# Patient Record
Sex: Female | Born: 1990 | Race: White | Hispanic: No | Marital: Single | Smoking: Former smoker
Health system: Southern US, Community
[De-identification: ages and names within clinical notes are randomized; demographics above are authoritative.]

## PROBLEM LIST (undated history)

## (undated) DIAGNOSIS — Z87891 Personal history of nicotine dependence: Secondary | ICD-10-CM

## (undated) DIAGNOSIS — G43909 Migraine, unspecified, not intractable, without status migrainosus: Secondary | ICD-10-CM

## (undated) DIAGNOSIS — F1010 Alcohol abuse, uncomplicated: Secondary | ICD-10-CM

## (undated) HISTORY — DX: Personal history of nicotine dependence: Z87.891

## (undated) HISTORY — PX: WISDOM TOOTH EXTRACTION: SHX21

## (undated) HISTORY — DX: Migraine, unspecified, not intractable, without status migrainosus: G43.909

## (undated) HISTORY — DX: Alcohol abuse, uncomplicated: F10.10

---

## 2014-09-08 ENCOUNTER — Ambulatory Visit: Payer: Self-pay | Admitting: Family Medicine

## 2015-03-21 ENCOUNTER — Ambulatory Visit (INDEPENDENT_AMBULATORY_CARE_PROVIDER_SITE_OTHER): Payer: Managed Care, Other (non HMO) | Admitting: Physician Assistant

## 2015-03-21 VITALS — BP 122/64 | HR 65 | Temp 98.4°F | Resp 16 | Ht 66.0 in | Wt 130.0 lb

## 2015-03-21 DIAGNOSIS — Z720 Tobacco use: Secondary | ICD-10-CM | POA: Insufficient documentation

## 2015-03-21 DIAGNOSIS — H6983 Other specified disorders of Eustachian tube, bilateral: Secondary | ICD-10-CM | POA: Diagnosis not present

## 2015-03-21 DIAGNOSIS — H6693 Otitis media, unspecified, bilateral: Secondary | ICD-10-CM | POA: Diagnosis not present

## 2015-03-21 DIAGNOSIS — Z3041 Encounter for surveillance of contraceptive pills: Secondary | ICD-10-CM

## 2015-03-21 MED ORDER — AMOXICILLIN 875 MG PO TABS: 875.0000 mg | ORAL_TABLET | Freq: Two times a day (BID) | ORAL | Status: AC

## 2015-03-21 MED ORDER — IPRATROPIUM BROMIDE 0.03 % NA SOLN: 2.0000 | Freq: Two times a day (BID) | NASAL | Status: DC

## 2015-03-21 MED ORDER — PSEUDOEPHEDRINE HCL ER 120 MG PO TB12: 120.0000 mg | ORAL_TABLET | Freq: Two times a day (BID) | ORAL | Status: DC

## 2015-03-21 NOTE — Patient Instructions (Signed)
Take sudafed once a day in the morning. Use atrovent nasal spray twice a day. Take antibiotic twice a day for 10 days. Take all antibiotics even if you start to feel better. Return if not resolved in 2 weeks.

## 2015-03-21 NOTE — Progress Notes (Signed)
[facility] [address], [place] [address] [phone]  Date:  03/21/2015   Name:  Heidi Long   DOB:  1991-01-24   MRN:  [id number]  PCP:  No primary care provider on file.    Chief Complaint: Ear Pain and Nasal Congestion   History of Present Illness:  This is a 24 y.o. female who is presenting with 3 days of nasal congestion and 1 day of bilateral otalgia. Nasal congestion started 3 days ago after "I feel asleep with a wet head". She was taking dayquil which was helping some. Today she flew from [place] to [place] and when she got off the plane she was having severe bilateral ear pain. She states she feels dizzy. Has had some sore throat. Denies fever, chills, cough. Has never had ETD before. She did have a history of recurrent otitis media in childhood.   Pt is a smoker and takes OCPs. She is aware of the risk of blood clots. She states she knows she needs to quit but is not quite ready yet.  Review of Systems:  Review of Systems See HPI  There are no active problems to display for this patient.   Prior to Admission medications   Medication Sig Start Date End Date Taking? Authorizing Provider  levonorgestrel-ethinyl estradiol (AVIANE,ALESSE,LESSINA) 0.1-20 MG-MCG tablet Take 1 tablet by mouth daily.   Yes Historical Provider, MD    No Known Allergies  History reviewed. No pertinent past surgical history.  Social History  Substance Use Topics  . Smoking status: Current Every Day Smoker -- 0.25 packs/day for 6 years    Types: Cigarettes  . Smokeless tobacco: None  . Alcohol Use: 3.0 oz/week    5 Standard drinks or equivalent per week    History reviewed. No pertinent family history.  Medication list has been reviewed and updated.  Physical Examination:  Physical Exam  Constitutional: She is oriented to person, place, and time. She appears well-developed and well-nourished. No distress.  HENT:  Head: Normocephalic and atraumatic.   Right Ear: Hearing, external ear and ear canal normal. Tympanic membrane is erythematous and bulging.  Left Ear: Hearing, external ear and ear canal normal. Tympanic membrane is erythematous and bulging.  Nose: Nose normal.  Mouth/Throat: Uvula is midline and mucous membranes are normal. Posterior oropharyngeal erythema present. No oropharyngeal exudate or posterior oropharyngeal edema.  Eyes: Conjunctivae and lids are normal. Right eye exhibits no discharge. Left eye exhibits no discharge. No scleral icterus.  Cardiovascular: Normal rate, regular rhythm, normal heart sounds and normal pulses.   No murmur heard. Pulmonary/Chest: Effort normal and breath sounds normal. No respiratory distress. She has no wheezes. She has no rhonchi. She has no rales.  Musculoskeletal: Normal range of motion.  Lymphadenopathy:       Head (right side): No submental, no submandibular and no tonsillar adenopathy present.       Head (left side): No submental, no submandibular and no tonsillar adenopathy present.    She has no cervical adenopathy.  Neurological: She is alert and oriented to person, place, and time.  Skin: Skin is warm, dry and intact. No lesion and no rash noted.  Psychiatric: She has a normal mood and affect. Her speech is normal and behavior is normal. Thought content normal.   BP 122/64 mmHg  Pulse 65  Temp(Src) 98.4 F (36.9 C) (Oral)  Resp 16  Ht 5\' 6"  (1.676 m)  Wt 130 lb (58.968 kg)  BMI 20.99  kg/m2  SpO2 99%  Assessment and Plan:  1. ETD (eustachian tube dysfunction), bilateral 2. Bilateral acute otitis media History is consistent with ETD, exam with bilateral bulging and erythematous TMs resembling early OM. Will treat ETD with sudafed and atrovent. Will treat OM with amoxil. Return if symptoms not improved after finishing abx. - pseudoephedrine (SUDAFED 12 HOUR) 120 MG 12 hr tablet; Take 1 tablet (120 mg total) by mouth 2 (two) times daily.  Dispense: 30 tablet; Refill: 0 -  ipratropium (ATROVENT) 0.03 % nasal spray; Place 2 sprays into both nostrils 2 (two) times daily.  Dispense: 30 mL; Refill: 0 - amoxicillin (AMOXIL) 875 MG tablet; Take 1 tablet (875 mg total) by mouth 2 (two) times daily.  Dispense: 20 tablet; Refill: 0  3. Tobacco abuse 4. Oral contraceptive use We discussed the dangers of tobacco use and OCP use. She understands the risks and knows she needs to stop smoking. She has not smoked in the past few days d/t illness. I advised this is the perfect time to quit.  Roswell Miners Dyke Brackett, MHS [facility]  03/21/2015

## 2015-08-30 ENCOUNTER — Ambulatory Visit (INDEPENDENT_AMBULATORY_CARE_PROVIDER_SITE_OTHER): Payer: 59 | Admitting: Physician Assistant

## 2015-08-30 VITALS — BP 120/80 | HR 85 | Temp 99.3°F | Resp 17 | Ht 67.0 in | Wt 133.0 lb

## 2015-08-30 DIAGNOSIS — R6889 Other general symptoms and signs: Secondary | ICD-10-CM

## 2015-08-30 DIAGNOSIS — J069 Acute upper respiratory infection, unspecified: Secondary | ICD-10-CM

## 2015-08-30 DIAGNOSIS — R0981 Nasal congestion: Secondary | ICD-10-CM | POA: Diagnosis not present

## 2015-08-30 LAB — POCT INFLUENZA A/B
Influenza A, POC: NEGATIVE
Influenza B, POC: NEGATIVE

## 2015-08-30 MED ORDER — GUAIFENESIN ER 1200 MG PO TB12: 1.0000 | ORAL_TABLET | Freq: Two times a day (BID) | ORAL | Status: DC | PRN

## 2015-08-30 NOTE — Progress Notes (Signed)
[facility] [address] [place] [address] [phone]  Date:  08/30/2015   Name:  Heidi Long   DOB:  04/19/1991   MRN:  [id number]  PCP:  No primary care provider on file.    History of Present Illness:  Heidi Long is a 25 y.o. female patient who presents to [facility] for cc of nasal congestion, sneezing, and sore throat.    Saturday, she was having sneezing, and sore throat.  No cough at this time.  She has mild nasal congestion.  She felt subjective fever and chills.  She has not taken anything for her symptoms.    Patient Active Problem List   Diagnosis Date Noted  . Tobacco abuse 03/21/2015  . Oral contraceptive use 03/21/2015    No past medical history on file.  No past surgical history on file.  Social History  Substance Use Topics  . Smoking status: Former Smoker -- 0.25 packs/day for 6 years    Types: Cigarettes    Quit date: 05/30/2015  . Smokeless tobacco: None  . Alcohol Use: 3.0 oz/week    5 Standard drinks or equivalent per week    No family history on file.  No Known Allergies  Medication list has been reviewed and updated.  Current Outpatient Prescriptions on File Prior to Visit  Medication Sig Dispense Refill  . levonorgestrel-ethinyl estradiol (AVIANE,ALESSE,LESSINA) 0.1-20 MG-MCG tablet Take 1 tablet by mouth daily.     No current facility-administered medications on file prior to visit.    ROS ROS otherwise unremarkable unless listed above.   Physical Examination: BP 120/80 mmHg  Pulse 85  Temp(Src) 99.3 F (37.4 C) (Oral)  Resp 17  Ht  (1.702 m)  Wt 133 lb (60.328 kg)  BMI 20.83 kg/m2  SpO2 99%  LMP 08/23/2015 Ideal Body Weight: Weight in (lb) to have BMI = 25: 159.3  Physical Exam  Constitutional: She is oriented to person, place, and time. She appears well-developed and well-nourished. No distress.  HENT:  Head: Normocephalic and atraumatic.  Right Ear: Tympanic membrane, external  ear and ear canal normal.  Left Ear: Tympanic membrane, external ear and ear canal normal.  Nose: Mucosal edema and rhinorrhea present. Right sinus exhibits no maxillary sinus tenderness and no frontal sinus tenderness. Left sinus exhibits no maxillary sinus tenderness and no frontal sinus tenderness.  Mouth/Throat: No uvula swelling. No oropharyngeal exudate, posterior oropharyngeal edema or posterior oropharyngeal erythema.  Eyes: Conjunctivae and EOM are normal. Pupils are equal, round, and reactive to light.  Cardiovascular: Normal rate and regular rhythm.  Exam reveals no gallop, no distant heart sounds and no friction rub.   No murmur heard. Pulmonary/Chest: Effort normal. No respiratory distress. She has no decreased breath sounds. She has no wheezes. She has no rhonchi.  Lymphadenopathy:       Head (right side): No submandibular, no tonsillar, no preauricular and no posterior auricular adenopathy present.       Head (left side): No submandibular, no tonsillar, no preauricular and no posterior auricular adenopathy present.  Neurological: She is alert and oriented to person, place, and time.  Skin: She is not diaphoretic.  Psychiatric: She has a normal mood and affect. Her behavior is normal.     Assessment and Plan: Heidi Long is a 25 y.o. female who is here today for congestion or congestion.   Acute upper respiratory infection - Plan: Guaifenesin (MUCINEX MAXIMUM STRENGTH) 1200 MG TB12  Flu-like symptoms -  Plan: POCT Influenza A/B, Guaifenesin (MUCINEX MAXIMUM STRENGTH) 1200 MG TB12  Nasal congestion - Plan: Guaifenesin (MUCINEX MAXIMUM STRENGTH) 1200 MG TB12   Trena Platt, PA-C [facility] 08/30/2015 7:04 PM

## 2015-08-30 NOTE — Patient Instructions (Signed)
Please hydrate 64 oz of water per day or more.   Take tylenol or ibuprofen every 6-8 hours, respectively, for pain or fever You can use over the counter delsym for cough Upper Respiratory Infection, Adult Most upper respiratory infections (URIs) are a viral infection of the air passages leading to the lungs. A URI affects the nose, throat, and upper air passages. The most common type of URI is nasopharyngitis and is typically referred to as "the common cold." URIs run their course and usually go away on their own. Most of the time, a URI does not require medical attention, but sometimes a bacterial infection in the upper airways can follow a viral infection. This is called a secondary infection. Sinus and middle ear infections are common types of secondary upper respiratory infections. Bacterial pneumonia can also complicate a URI. A URI can worsen asthma and chronic obstructive pulmonary disease (COPD). Sometimes, these complications can require emergency medical care and may be life threatening.  CAUSES Almost all URIs are caused by viruses. A virus is a type of germ and can spread from one person to another.  RISKS FACTORS You may be at risk for a URI if:   You smoke.   You have chronic heart or lung disease.  You have a weakened defense (immune) system.   You are very young or very old.   You have nasal allergies or asthma.  You work in crowded or poorly ventilated areas.  You work in health care facilities or schools. SIGNS AND SYMPTOMS  Symptoms typically develop 2-3 days after you come in contact with a cold virus. Most viral URIs last 7-10 days. However, viral URIs from the influenza virus (flu virus) can last 14-18 days and are typically more severe. Symptoms may include:   Runny or stuffy (congested) nose.   Sneezing.   Cough.   Sore throat.   Headache.   Fatigue.   Fever.   Loss of appetite.   Pain in your forehead, behind your eyes, and over your  cheekbones (sinus pain).  Muscle aches.  DIAGNOSIS  Your health care provider may diagnose a URI by:  Physical exam.  Tests to check that your symptoms are not due to another condition such as:  Strep throat.  Sinusitis.  Pneumonia.  Asthma. TREATMENT  A URI goes away on its own with time. It cannot be cured with medicines, but medicines may be prescribed or recommended to relieve symptoms. Medicines may help:  Reduce your fever.  Reduce your cough.  Relieve nasal congestion. HOME CARE INSTRUCTIONS   Take medicines only as directed by your health care provider.   Gargle warm saltwater or take cough drops to comfort your throat as directed by your health care provider.  Use a warm mist humidifier or inhale steam from a shower to increase air moisture. This may make it easier to breathe.  Drink enough fluid to keep your urine clear or pale yellow.   Eat soups and other clear broths and maintain good nutrition.   Rest as needed.   Return to work when your temperature has returned to normal or as your health care provider advises. You may need to stay home longer to avoid infecting others. You can also use a face mask and careful hand washing to prevent spread of the virus.  Increase the usage of your inhaler if you have asthma.   Do not use any tobacco products, including cigarettes, chewing tobacco, or electronic cigarettes. If you need help quitting,  ask your health care provider. PREVENTION  The best way to protect yourself from getting a cold is to practice good hygiene.   Avoid oral or hand contact with people with cold symptoms.   Wash your hands often if contact occurs.  There is no clear evidence that vitamin C, vitamin E, echinacea, or exercise reduces the chance of developing a cold. However, it is always recommended to get plenty of rest, exercise, and practice good nutrition.  SEEK MEDICAL CARE IF:   You are getting worse rather than better.    Your symptoms are not controlled by medicine.   You have chills.  You have worsening shortness of breath.  You have brown or red mucus.  You have yellow or brown nasal discharge.  You have pain in your face, especially when you bend forward.  You have a fever.  You have swollen neck glands.  You have pain while swallowing.  You have white areas in the back of your throat. SEEK IMMEDIATE MEDICAL CARE IF:   You have severe or persistent:  Headache.  Ear pain.  Sinus pain.  Chest pain.  You have chronic lung disease and any of the following:  Wheezing.  Prolonged cough.  Coughing up blood.  A change in your usual mucus.  You have a stiff neck.  You have changes in your:  Vision.  Hearing.  Thinking.  Mood. MAKE SURE YOU:   Understand these instructions.  Will watch your condition.  Will get help right away if you are not doing well or get worse.   This information is not intended to replace advice given to you by your health care provider. Make sure you discuss any questions you have with your health care provider.   Document Released: 12/19/2000 Document Revised: 11/09/2014 Document Reviewed: 09/30/2013 Elsevier Interactive Patient Education [employer].

## 2015-09-09 ENCOUNTER — Other Ambulatory Visit: Payer: Self-pay | Admitting: Family Medicine

## 2015-09-09 NOTE — Telephone Encounter (Signed)
Please let Eustace QuailRachel Marie Kuhlmann know that I'd like to see patient for an appointment here in the office for:  Physical, birth control Please schedule a visit with me  in the next: 3 weeks Fasting?  Not necessary Thank you, Dr. Kayleen MemosLada I'll send one Rx for OCPs but will need to be seen for any beyond that

## 2015-09-15 ENCOUNTER — Encounter: Payer: Self-pay | Admitting: Family Medicine

## 2015-09-15 NOTE — Telephone Encounter (Signed)
Sent letter home 09/15/15 °

## 2016-05-09 ENCOUNTER — Ambulatory Visit (INDEPENDENT_AMBULATORY_CARE_PROVIDER_SITE_OTHER): Payer: 59 | Admitting: Family Medicine

## 2016-05-09 VITALS — BP 122/80 | HR 73 | Temp 98.4°F | Resp 16 | Ht 66.0 in | Wt 145.2 lb

## 2016-05-09 DIAGNOSIS — R41840 Attention and concentration deficit: Secondary | ICD-10-CM | POA: Diagnosis not present

## 2016-05-09 DIAGNOSIS — R7309 Other abnormal glucose: Secondary | ICD-10-CM

## 2016-05-09 DIAGNOSIS — H1189 Other specified disorders of conjunctiva: Secondary | ICD-10-CM

## 2016-05-09 DIAGNOSIS — R5383 Other fatigue: Secondary | ICD-10-CM | POA: Diagnosis not present

## 2016-05-09 DIAGNOSIS — Z23 Encounter for immunization: Secondary | ICD-10-CM | POA: Diagnosis not present

## 2016-05-09 LAB — COMPLETE METABOLIC PANEL WITH GFR
ALBUMIN: 4.5 g/dL (ref 3.6–5.1)
ALT: 11 U/L (ref 6–29)
AST: 17 U/L (ref 10–30)
Alkaline Phosphatase: 83 U/L (ref 33–115)
BUN: 8 mg/dL (ref 7–25)
CALCIUM: 9.7 mg/dL (ref 8.6–10.2)
CHLORIDE: 104 mmol/L (ref 98–110)
CO2: 26 mmol/L (ref 20–31)
CREATININE: 0.73 mg/dL (ref 0.50–1.10)
GFR, Est African American: >89 mL/min (ref >=60)
GFR, Est Non African American: >89 mL/min (ref >=60)
GLUCOSE: 88 mg/dL (ref 65–99)
Potassium: 4.3 mmol/L (ref 3.5–5.3)
Sodium: 140 mmol/L (ref 135–146)
Total Bilirubin: 0.5 mg/dL (ref 0.2–1.2)
Total Protein: 7 g/dL (ref 6.1–8.1)

## 2016-05-09 LAB — POCT CBC
Granulocyte percent: 67.2 %G (ref 37–80)
HCT, POC: 37.1 % — AB (ref 37.7–47.9)
HEMOGLOBIN: 13.5 g/dL (ref 12.2–16.2)
Lymph, poc: 2 (ref 0.6–3.4)
MCH: 32.3 pg — AB (ref 27–31.2)
MCHC: 36.4 g/dL — AB (ref 31.8–35.4)
MCV: 88.6 fL (ref 80–97)
MID (CBC): 0.6 (ref 0–0.9)
MPV: 9.3 fL (ref 0–99.8)
PLATELET COUNT, POC: 206 10*3/uL (ref 142–424)
POC Granulocyte: 5.4 (ref 2–6.9)
POC LYMPH PERCENT: 24.7 %L (ref 10–50)
POC MID %: 8.1 %M (ref 0–12)
RBC: 4.19 M/uL (ref 4.04–5.48)
RDW, POC: 12.5 %
WBC: 8 10*3/uL (ref 4.6–10.2)

## 2016-05-09 LAB — GLUCOSE, POCT (MANUAL RESULT ENTRY): POC GLUCOSE: 92 mg/dL (ref 70–99)

## 2016-05-09 LAB — POCT GLYCOSYLATED HEMOGLOBIN (HGB A1C): HEMOGLOBIN A1C: 4.9

## 2016-05-09 LAB — TSH: TSH: 0.89 m[IU]/L

## 2016-05-09 NOTE — Patient Instructions (Addendum)
Your blood glucose level was normal today.  I do not suspect diabetes is the cause of your symptoms.  Your CBC (looks for anemia and infection) was normal.  I am checking for electrolyte abnormalities, kidney function, liver function, thyroid function and to see if there is any evidence of CO poisoning.  I will contact you with the results of these labs.  If your symptoms worsen, please seek immediate medical attention in the emergency department.   IF you received an x-ray today, you will receive an invoice from [facility]. Please contact [facility] at [phone] with questions or concerns regarding your invoice.   IF you received labwork today, you will receive an invoice from [facility]. Please contact [facility] at [phone] with questions or concerns regarding your invoice.   Our billing staff will not be able to assist you with questions regarding bills from these companies.  You will be contacted with the lab results as soon as they are available. The fastest way to get your results is to activate your My Chart account. Instructions are located on the last page of this paperwork. If you have not heard from us regarding the results in 2 weeks, please contact this office.

## 2016-05-09 NOTE — Progress Notes (Signed)
Subjective: CC: abnormal blood glucose ZOX:[id number]:Heidi Long is a 25 y.o. female presenting to clinic today for same day appointment. PCP: No PCP Per Patient Concerns today include:  Abnormal blood glucose Patient reports that over the last 4 days she has felt out of sorts, notes difficulty concentrating, thinking, performing simple tasks.  She feels like she is "high".  She reports increased thirst.  Urination is normal.  Endorses light sensitivity, tingling around eyes and on face.  She denies LOC.  She reports she took a POC BG and it was 158 about 2 hours after eating.  Denies headaches, numbness/ tingling in hands or feet.  No recent illness.  Denies heavy menstrual cycles.  Currently menstruating.  Not on any medications.  No known medical problems.  Reports a history of thyroid disorder on paternal side.  She reports family history of T2DM on her mother's side.  Last meal today at 12pm.  Social History Reviewed: non smoker. FamHx and MedHx reviewed.  Please see EMR.  ROS: Per HPI  Objective: Office vital signs reviewed. BP 122/80   Pulse 73   Temp 98.4 F (36.9 C) (Oral)   Resp 16   Ht 5\' 6"  (1.676 m)   Wt 145 lb 3.2 oz (65.9 kg)   LMP 05/09/2016   SpO2 100%   BMI 23.44 kg/m   Physical Examination:  General: Awake, alert, well nourished, No acute distress HEENT: Normal    Neck: No masses palpated. No lymphadenopathy, no thyromegaly or palpable nodules    Eyes: PERRLA, EOMI, conjunctival pallor noted.    Nose: nasal turbinates moist    Throat: moist mucus membranes, no erythema Cardio: regular rate and rhythm, S1S2 heard, no murmurs appreciated Pulm: clear to auscultation bilaterally, no wheezes, rhonchi or rales, normal WOB on room air Extremities: warm, well perfused, No edema, cyanosis or clubbing; +2 raidal pulses bilaterally MSK: Normal gait and station Neuro: Strength and sensation grossly intact, CN 2-12 grossly in tact, no focal neurologic  deficits  Orthostatic VS for the past 24 hrs:  BP- Lying Pulse- Lying BP- Sitting Pulse- Sitting BP- Standing at 0 minutes Pulse- Standing at 0 minutes  05/09/16 1528 115/76 59 126/82 64 119/78 73   Results for orders placed or performed in visit on 05/09/16 (from the past 24 hour(s))  POCT glucose (manual entry)     Status: None   Collection Time: 05/09/16  3:04 PM  Result Value Ref Range   POC Glucose 92 70 - 99 mg/dl  POCT CBC     Status: Abnormal   Collection Time: 05/09/16  3:05 PM  Result Value Ref Range   WBC 8.0 4.6 - 10.2 K/uL   Lymph, poc 2.0 0.6 - 3.4   POC LYMPH PERCENT 24.7 10 - 50 %L   MID (cbc) 0.6 0 - 0.9   POC MID % 8.1 0 - 12 %M   POC Granulocyte 5.4 2 - 6.9   Granulocyte percent 67.2 37 - 80 %G   RBC 4.19 4.04 - 5.48 M/uL   Hemoglobin 13.5 12.2 - 16.2 g/dL   HCT, POC 40.937.1 (A) 81.137.7 - 47.9 %   MCV 88.6 80 - 97 fL   MCH, POC 32.3 (A) 27 - 31.2 pg   MCHC 36.4 (A) 31.8 - 35.4 g/dL   RDW, POC 91.412.5 %   Platelet Count, POC 206 142 - 424 K/uL   MPV 9.3 0 - 99.8 fL  POCT glycosylated hemoglobin (Hb A1C)  Status: None   Collection Time: 05/09/16  3:36 PM  Result Value Ref Range   Hemoglobin A1C 4.9    Assessment/ Plan: 25 y.o. female   1. Difficulty concentrating.  Unclear etiology at this time.  DDx includes thyroid dysfunction, early viral infection, CO poisoning.  BG and CBC within normal limits. - COMPLETE METABOLIC PANEL WITH GFR - CBC - TSH - Carboxyhemoglobin  2. Needs flu shot - Flu Vaccine QUAD 36+ mos IM  3. Elevated glucose.  AACE goal for 2h pp BG is <140. ADA is <180.  Patient is nonobese female.  Would be very surprised if she has T2DM.  Could consider DM1.5 as possible dx.  BG here 92. A1c 4.9. - POCT glucose (manual entry) - POCT A1c - Reassurance  4. Conjunctival pallor. Actively menstruating.  Hgb 13.5 is reassuring. - CBC  5. Fatigue, unspecified type. No evidence of anemia on CBC.  BG WNL.  Low suspicion for CO poisoning at this  time but given recent change in season and constellation of symptoms, will check CHgb.  Symptoms could be a result of just working too much, as she is working 12 hour days with few breaks. - COMPLETE METABOLIC PANEL WITH GFR - CBC - TSH - Carboxyhemoglobin - Orthostatic vitals obtained and were normal - Return precautions reviewed - Will contact with lab results.  Ok per patient to leave VM with results. - Follow up prn  Raliegh IpAshly M Monifa Blanchette, DO PGY-3, [facility]

## 2016-05-10 ENCOUNTER — Telehealth: Payer: Self-pay | Admitting: Family Medicine

## 2016-05-10 NOTE — Telephone Encounter (Signed)
Patient reports that she is feeling better this morning.  She notes that she took some B12 last night.  Discussed normal results.  Still awaiting Carboxyhemoglobin level.  Return precautions reviewed with patient.  Mindee Robledo M. Nadine CountsGottschalk, DO PGY-3, [facility]

## 2016-05-11 ENCOUNTER — Telehealth: Payer: Self-pay | Admitting: Family Medicine

## 2016-05-11 LAB — CARBOXYHEMOGLOBIN

## 2016-05-11 NOTE — Telephone Encounter (Signed)
Per patient's request LVM re: labs. Carboxyhemoglobin level negative.  [name], DO PGY-3, [facility]

## 2016-06-14 ENCOUNTER — Ambulatory Visit (INDEPENDENT_AMBULATORY_CARE_PROVIDER_SITE_OTHER): Payer: 59 | Admitting: Family Medicine

## 2016-06-14 ENCOUNTER — Encounter: Payer: Self-pay | Admitting: Family Medicine

## 2016-06-14 ENCOUNTER — Other Ambulatory Visit (HOSPITAL_COMMUNITY)
Admission: RE | Admit: 2016-06-14 | Discharge: 2016-06-14 | Disposition: A | Discharge status: Home / Self Care | Payer: 59 | Source: Ambulatory Visit | Attending: Family Medicine | Admitting: Family Medicine

## 2016-06-14 VITALS — BP 122/92 | HR 76 | Temp 97.8°F | Ht 66.0 in | Wt 144.2 lb

## 2016-06-14 DIAGNOSIS — Z87891 Personal history of nicotine dependence: Secondary | ICD-10-CM | POA: Insufficient documentation

## 2016-06-14 DIAGNOSIS — Z113 Encounter for screening for infections with a predominantly sexual mode of transmission: Secondary | ICD-10-CM | POA: Insufficient documentation

## 2016-06-14 DIAGNOSIS — Z3041 Encounter for surveillance of contraceptive pills: Secondary | ICD-10-CM | POA: Diagnosis not present

## 2016-06-14 DIAGNOSIS — G43909 Migraine, unspecified, not intractable, without status migrainosus: Secondary | ICD-10-CM | POA: Insufficient documentation

## 2016-06-14 DIAGNOSIS — Z7251 High risk heterosexual behavior: Secondary | ICD-10-CM

## 2016-06-14 DIAGNOSIS — Z0000 Encounter for general adult medical examination without abnormal findings: Secondary | ICD-10-CM

## 2016-06-14 DIAGNOSIS — F1010 Alcohol abuse, uncomplicated: Secondary | ICD-10-CM | POA: Insufficient documentation

## 2016-06-14 LAB — CBC
HCT: 41.9 % (ref 36.0–46.0)
HEMOGLOBIN: 14.2 g/dL (ref 12.0–15.0)
MCHC: 33.9 g/dL (ref 30.0–36.0)
MCV: 90.4 fl (ref 78.0–100.0)
Platelets: 240 10*3/uL (ref 150.0–400.0)
RBC: 4.63 Mil/uL (ref 3.87–5.11)
RDW: 13.1 % (ref 11.5–15.5)
WBC: 5.9 10*3/uL (ref 4.0–10.5)

## 2016-06-14 LAB — LIPID PANEL
Cholesterol: 157 mg/dL (ref 0–200)
HDL: 71.2 mg/dL (ref >39.00)
LDL Cholesterol: 70 mg/dL (ref 0–99)
NONHDL: 85.39
Total CHOL/HDL Ratio: 2
Triglycerides: 75 mg/dL (ref 0.0–149.0)
VLDL: 15 mg/dL (ref 0.0–40.0)

## 2016-06-14 LAB — COMPREHENSIVE METABOLIC PANEL
ALK PHOS: 107 U/L (ref 39–117)
ALT: 10 U/L (ref 0–35)
AST: 15 U/L (ref 0–37)
Albumin: 4.6 g/dL (ref 3.5–5.2)
BILIRUBIN TOTAL: 0.7 mg/dL (ref 0.2–1.2)
BUN: 7 mg/dL (ref 6–23)
CO2: 30 mEq/L (ref 19–32)
CREATININE: 0.67 mg/dL (ref 0.40–1.20)
Calcium: 9.5 mg/dL (ref 8.4–10.5)
Chloride: 103 mEq/L (ref 96–112)
GFR: 113.75 mL/min (ref >60.00)
GLUCOSE: 95 mg/dL (ref 70–99)
Potassium: 4.2 mEq/L (ref 3.5–5.1)
Sodium: 140 mEq/L (ref 135–145)
TOTAL PROTEIN: 7.3 g/dL (ref 6.0–8.3)

## 2016-06-14 LAB — TSH: TSH: 1.06 u[IU]/mL (ref 0.35–4.50)

## 2016-06-14 NOTE — Progress Notes (Signed)
Pre visit review using our clinic review tool, if applicable. No additional management support is needed unless otherwise documented below in the visit note. 

## 2016-06-14 NOTE — Progress Notes (Signed)
Phone: [phone]  Subjective:  Patient presents today to establish care. Heard about us through urgent care. Prior Dr. Sherie DonLadaCheree Ditto- [facility]- did gynecology and primary care for patient.  Chief complaint-noted.   See problem oriented charting  The following were reviewed and entered/updated in epic: Past Medical History:  Diagnosis Date  . Alcohol abuse    Cut down on her own. drunk on regular basis in past in college. Prescription drug abuse- oxycodone and xanax snorting stopped a year or two after college.   . Former smoker    quit 2016. 1/4 PPD 7 years.   . Migraines    2016 was getting them frequently- doing much better now. no medicine   Patient Active Problem List   Diagnosis Date Noted  . Alcohol abuse     Priority: High  . Tobacco abuse 03/21/2015    Priority: High  . Migraines     Priority: Medium  . Oral contraceptive use 03/21/2015    Priority: Medium  . Former smoker     Priority: Low   Past Surgical History:  Procedure Laterality Date  . WISDOM TOOTH EXTRACTION      Family History  Problem Relation Age of Onset  . Depression Mother     also obese  . Drug abuse Mother     prescription drug abuse  . Arrhythmia Father   . Hyperlipidemia Father   . Peripheral vascular disease Father     carotid artery  . Alcohol abuse Father   . Hypertension Father   . Healthy Sister     half sister  . Alcohol abuse Brother   . Heart attack Maternal Grandmother   . Other Maternal Grandfather     accidental death  . Other Paternal Grandmother     "enlarged heart" never saw doctor  . Colon cancer Paternal Grandmother   . Heart attack Paternal Grandfather     under 50    Medications- reviewed and updated Current Outpatient Prescriptions  Medication Sig Dispense Refill  . levonorgestrel-ethinyl estradiol (AVIANE) 0.1-20 MG-MCG tablet Take 1 tablet by mouth daily. Appt/physical needed for further refills 28 tablet 0   No current facility-administered medications for  this visit.     Allergies-reviewed and updated No Known Allergies  Social History   Social History  . Marital status: Single    Spouse name: N/A  . Number of children: N/A  . Years of education: N/A   Social History Main Topics  . Smoking status: Former Smoker    Packs/day: 0.25    Years: 6.00    Types: Cigarettes    Quit date: 05/30/2015  . Smokeless tobacco: Never Used  . Alcohol use 4.2 oz/week    7 Standard drinks or equivalent per week  . Drug use: No  . Sexual activity: Not Asked   Other Topics Concern  . None   Social History Narrative   Single. Lives alone. 2 kitties.       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       Hobbies: youtube and music, enjoys makeup.     ROS--Full ROS was completed Review of Systems  Constitutional: Negative for chills and fever.  HENT: Negative for ear discharge and ear pain.   Eyes: Negative for blurred vision.  Respiratory: Negative for cough and hemoptysis.  Cardiovascular: Negative for chest pain and palpitations.  Gastrointestinal: Negative for heartburn and nausea.  Genitourinary: Negative for dysuria and urgency.  Musculoskeletal: Negative for myalgias and neck pain.  Skin: Negative for itching and rash.  Neurological: Negative for dizziness and headaches.  Endo/Heme/Allergies: Negative for polydipsia. Does not bruise/bleed easily.  Psychiatric/Behavioral: Positive for substance abuse (in past but not recently). Negative for hallucinations. The patient is nervous/anxious.    Objective: BP (!) 122/92   Pulse 76   Temp 97.8 F (36.6 C) (Oral)   Ht 5\' 6"  (1.676 m)   Wt 144 lb 3.2 oz (65.4 kg)   LMP 06/12/2016 (Exact Date)   SpO2 98%   BMI 23.27 kg/m  Gen: NAD, resting comfortably HEENT: Mucous membranes are moist. Oropharynx normal. TM normal. Eyes: sclera and  lids normal, PERRLA Neck: no thyromegaly, no cervical lymphadenopathy CV: RRR no murmurs rubs or gallops Lungs: CTAB no crackles, wheeze, rhonchi Abdomen: soft/nontender/nondistended/normal bowel sounds. No rebound or guarding.  Ext: no edema Skin: warm, dry Neuro: 5/5 strength in upper and lower extremities, normal gait, normal reflexes  Assessment/Plan:  25 y.o. female presenting for annual physical.  Health Maintenance counseling: 1. Anticipatory guidance: Patient counseled regarding regular dental exams, eye exams (contacts), wearing seatbelts.  2. Risk factor reduction:  Advised patient of need for regular exercise and diet rich and fruits and vegetables to reduce risk of heart attack and stroke. Not exercising- advised to start. Diet much improved for about last 2 months- feels much better.  3. Immunizations/screenings/ancillary studies Immunization History  Administered Date(s) Administered  . Influenza,inj,Quad PF,36+ Mos 05/09/2016  . Tdap 03/11/2010   Health Maintenance Due  Topic Date Due  . HIV Screening - with future fasting labs 03/11/2006  . PAP SMEAR - get records 03/11/2012   4. Cervical cancer screening- get records 5. Breast cancer screening-  breast exam defer and mammogram starting age 40 or 62. Apparently had a benign cyst on exam but mammogram normal a few years ago.  6. Colon cancer screening - no family history other than grandma age 21, start at age 69.  10. Skin cancer prevention- advised regular sunscreen use  8. STD screening- not using protection- opts into std testing 9. Birth control- advised condoms. Wants to restart birth control. LMP 10 days ago. Will get u preg given unprotected sex just to confirm before restarting. Migraine history but without aura  Will monitor BP- patient admits to anxiety with new doctor. Discussed 130/80 goal.  BP Readings from Last 3 Encounters:  06/14/16 (!) 122/92  05/09/16 122/80  08/30/15 120/80   Month or two ago  body was reacting oddly. States she changed diet to more vegetarian and really seemed to help. Vision was off, slightly confused- ? Withdrawals from alcohol- had prior cut down on alcohol use so likely was not related to that.   1 year CPE   Orders Placed This Encounter  Procedures  . CBC    [place]    Standing Status:   Future    Number of Occurrences:   1    Standing Expiration Date:   06/14/2017  . Comprehensive metabolic panel        Standing Status:   Future    Number of Occurrences:   1    Standing Expiration Date:   06/14/2017    Order Specific Question:   Has the patient fasted?    Answer:   No  . Lipid panel      Standing Status:   Future    Number of Occurrences:   1    Standing Expiration Date:   06/14/2017    Order Specific Question:   Has the patient fasted?    Answer:   No  . TSH    [place]    Standing Status:   Future    Number of Occurrences:   1    Standing Expiration Date:   06/14/2017  . HIV antibody    Standing Status:   Future    Number of Occurrences:   1    Standing Expiration Date:   06/14/2017  . RPR    [facility]    Standing Status:   Future    Number of Occurrences:   1    Standing Expiration Date:   06/14/2017  . POCT urine pregnancy   Return precautions advised.  Tana ConchStephen Hunter, MD

## 2016-06-14 NOTE — Patient Instructions (Addendum)
Sign release of information at the check out desk for records from prior PCP- definitely want pap smear copy  Urine pregnancy before you leave- if negative- send birth control in  Always advise protected sex  Goal 150 minutes a week exercise  BP Readings from Last 3 Encounters:  06/14/16 (!) 122/92  05/09/16 122/80  08/30/15 120/80  goal blood pressure <130/80. Regular exercise, healthy eating can help- glad you have been working on this. Some of this couldbe stress related as well  Schedule a lab visit at the check out desk within 2 weeks. Return for future fasting labs meaning nothing but water after midnight please. Ok to take your medications with water. Do not pee for an hour before you come in for the urine STD test

## 2016-06-15 LAB — RPR

## 2016-06-15 LAB — URINE CYTOLOGY ANCILLARY ONLY
Chlamydia: NEGATIVE
Neisseria Gonorrhea: NEGATIVE
TRICH (WINDOWPATH): NEGATIVE

## 2016-06-15 LAB — HIV ANTIBODY (ROUTINE TESTING W REFLEX): HIV: NONREACTIVE

## 2016-06-18 ENCOUNTER — Other Ambulatory Visit (INDEPENDENT_AMBULATORY_CARE_PROVIDER_SITE_OTHER): Payer: 59

## 2016-06-18 ENCOUNTER — Other Ambulatory Visit: Payer: Self-pay | Admitting: Family Medicine

## 2016-06-18 DIAGNOSIS — Z309 Encounter for contraceptive management, unspecified: Secondary | ICD-10-CM | POA: Diagnosis not present

## 2016-06-18 LAB — POCT URINE PREGNANCY: PREG TEST UR: NEGATIVE

## 2016-06-18 MED ORDER — LEVONORGESTREL-ETHINYL ESTRAD 0.1-20 MG-MCG PO TABS: 1.0000 | ORAL_TABLET | Freq: Every day | ORAL | 12 refills | Status: DC

## 2017-02-20 ENCOUNTER — Ambulatory Visit (INDEPENDENT_AMBULATORY_CARE_PROVIDER_SITE_OTHER): Payer: 59 | Admitting: Family Medicine

## 2017-02-20 ENCOUNTER — Encounter: Payer: Self-pay | Admitting: Family Medicine

## 2017-02-20 VITALS — BP 102/82 | HR 83 | Temp 98.3°F | Ht 66.0 in | Wt 136.4 lb

## 2017-02-20 DIAGNOSIS — F419 Anxiety disorder, unspecified: Secondary | ICD-10-CM | POA: Diagnosis not present

## 2017-02-20 LAB — COMPREHENSIVE METABOLIC PANEL
ALK PHOS: 90 U/L (ref 39–117)
ALT: 11 U/L (ref 0–35)
AST: 15 U/L (ref 0–37)
Albumin: 4.8 g/dL (ref 3.5–5.2)
BILIRUBIN TOTAL: 0.8 mg/dL (ref 0.2–1.2)
BUN: 8 mg/dL (ref 6–23)
CALCIUM: 9.9 mg/dL (ref 8.4–10.5)
CO2: 30 meq/L (ref 19–32)
CREATININE: 0.68 mg/dL (ref 0.40–1.20)
Chloride: 105 mEq/L (ref 96–112)
GFR: 111.21 mL/min (ref >60.00)
Glucose, Bld: 91 mg/dL (ref 70–99)
Potassium: 4.4 mEq/L (ref 3.5–5.1)
Sodium: 140 mEq/L (ref 135–145)
TOTAL PROTEIN: 6.8 g/dL (ref 6.0–8.3)

## 2017-02-20 LAB — CBC
HCT: 43.4 % (ref 36.0–46.0)
Hemoglobin: 14.5 g/dL (ref 12.0–15.0)
MCHC: 33.4 g/dL (ref 30.0–36.0)
MCV: 91.5 fl (ref 78.0–100.0)
Platelets: 259 10*3/uL (ref 150.0–400.0)
RBC: 4.74 Mil/uL (ref 3.87–5.11)
RDW: 13.5 % (ref 11.5–15.5)
WBC: 5 10*3/uL (ref 4.0–10.5)

## 2017-02-20 LAB — TSH: TSH: 0.83 u[IU]/mL (ref 0.35–4.50)

## 2017-02-20 MED ORDER — BUSPIRONE HCL 5 MG PO TABS: 5.0000 mg | ORAL_TABLET | Freq: Three times a day (TID) | ORAL | 2 refills | Status: DC | PRN
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
## 2017-02-20 NOTE — Patient Instructions (Signed)
Please stop by lab before you go- rule out causes of anxiety  Trial buspirone 5mg  up to three times a day as needed  Would strongly encourage seeing one of our psychologists/counselors- and definitely doing this in [place]  Would love to check in with you 1 month from now to see how you are doing

## 2017-02-21 DIAGNOSIS — F419 Anxiety disorder, unspecified: Secondary | ICD-10-CM | POA: Insufficient documentation

## 2018-07-29 DIAGNOSIS — J019 Acute sinusitis, unspecified: Secondary | ICD-10-CM | POA: Diagnosis not present

## 2018-08-05 DIAGNOSIS — J019 Acute sinusitis, unspecified: Secondary | ICD-10-CM | POA: Diagnosis not present

## 2018-12-09 DIAGNOSIS — H10811 Pingueculitis, right eye: Secondary | ICD-10-CM | POA: Diagnosis not present

## 2018-12-24 DIAGNOSIS — M9901 Segmental and somatic dysfunction of cervical region: Secondary | ICD-10-CM | POA: Diagnosis not present

## 2018-12-24 DIAGNOSIS — M9902 Segmental and somatic dysfunction of thoracic region: Secondary | ICD-10-CM | POA: Diagnosis not present

## 2018-12-24 DIAGNOSIS — M545 Low back pain: Secondary | ICD-10-CM | POA: Diagnosis not present

## 2018-12-24 DIAGNOSIS — M9905 Segmental and somatic dysfunction of pelvic region: Secondary | ICD-10-CM | POA: Diagnosis not present

## 2018-12-24 DIAGNOSIS — M9903 Segmental and somatic dysfunction of lumbar region: Secondary | ICD-10-CM | POA: Diagnosis not present

## 2018-12-24 DIAGNOSIS — M62838 Other muscle spasm: Secondary | ICD-10-CM | POA: Diagnosis not present

## 2018-12-24 DIAGNOSIS — R600 Localized edema: Secondary | ICD-10-CM | POA: Diagnosis not present

## 2018-12-24 DIAGNOSIS — M542 Cervicalgia: Secondary | ICD-10-CM | POA: Diagnosis not present

## 2019-06-23 DIAGNOSIS — H04123 Dry eye syndrome of bilateral lacrimal glands: Secondary | ICD-10-CM | POA: Diagnosis not present

## 2019-08-05 ENCOUNTER — Ambulatory Visit: Payer: BC Managed Care – PPO | Attending: Internal Medicine

## 2019-08-05 DIAGNOSIS — Z20822 Contact with and (suspected) exposure to covid-19: Secondary | ICD-10-CM

## 2019-08-06 LAB — NOVEL CORONAVIRUS, NAA: SARS-CoV-2, NAA: NOT DETECTED

## 2019-09-08 DIAGNOSIS — M9902 Segmental and somatic dysfunction of thoracic region: Secondary | ICD-10-CM | POA: Diagnosis not present

## 2019-09-08 DIAGNOSIS — M9901 Segmental and somatic dysfunction of cervical region: Secondary | ICD-10-CM | POA: Diagnosis not present

## 2019-09-08 DIAGNOSIS — M791 Myalgia, unspecified site: Secondary | ICD-10-CM | POA: Diagnosis not present

## 2019-09-14 ENCOUNTER — Ambulatory Visit: Payer: BC Managed Care – PPO | Attending: Internal Medicine

## 2019-09-14 DIAGNOSIS — Z20822 Contact with and (suspected) exposure to covid-19: Secondary | ICD-10-CM | POA: Diagnosis not present

## 2019-09-15 LAB — NOVEL CORONAVIRUS, NAA: SARS-CoV-2, NAA: NOT DETECTED

## 2020-01-18 NOTE — Progress Notes (Signed)
Phone [phone] In person visit   Subjective:   Heidi Long is a 29 y.o. year old very pleasant female patient who presents for/with See problem oriented charting Chief Complaint  Patient presents with  . Referral    needs referral to derm  . Acne    on back and sides    This visit occurred during the SARS-CoV-2 public health emergency.  Safety protocols were in place, including screening questions prior to the visit, additional usage of staff PPE, and extensive cleaning of exam room while observing appropriate contact time as indicated for disinfecting solutions.   Past Medical History-  Patient Active Problem List   Diagnosis Date Noted  . Anxiety 02/21/2017    Priority: High  . Alcohol abuse     Priority: High  . Tobacco abuse 03/21/2015    Priority: High  . Migraines     Priority: Medium  . Oral contraceptive use 03/21/2015    Priority: Medium  . Former smoker     Priority: Low    Medications- reviewed and updated Current Outpatient Medications  Medication Sig Dispense Refill  . levonorgestrel-ethinyl estradiol (AVIANE) 0.1-20 MG-MCG tablet Take 1 tablet by mouth daily. 28 tablet 12   No current facility-administered medications for this visit.     Objective:  BP 120/72   Pulse 79   Temp 98.5 F (36.9 C) (Temporal)   Ht 5\' 6"  (1.676 m)   Wt 139 lb (63 kg)   LMP 01/05/2020   SpO2 98%   BMI 22.44 kg/m  Gen: NAD, resting comfortably CV: RRR no murmurs rubs or gallops Lungs: CTAB no crackles, wheeze, rhonchi Ext: no edema Skin: warm, dry, patient with multiple nodules/comedones on her back    Assessment and Plan    #Acne vulgaris-primarily of back #Unprotected sex/desire for birth control #Anxiety S:has acne on back and sides. Has been seen by dermatology in the past. Also has dry skin on arms-has to exfoliate this aggressively or can get bumps on the arms that become itchy. Tried scrubs and topical over the counter treatments no improvement  forearms and back  Patient is dating/sexually active with boyfriend.  Does not use condoms.  LMP June 29.With PMS, she has noted feels irritable. In college felt more emotional on birth control but more recently has done well with this.  Stopped birth control around 2 years ago when moved to [place].  Has a history of some anxiety issues-felt too sleepy on buspirone.  Does not want medication at this time beyond seeing how she does on birth control.  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  She is also working on her MBA at [employer].  She has to [place] for work  She feels like acne may have been worse since that time.  Did well on aviane birth control in the past.  She did quit smoking which lowers VTE risk A/P: For unprotected sex/birth control counseling-patient is going to restart combined oral contraceptives.  She has also had worsening acne particularly on her back-this may help her acne but if not she would like to have a referral to dermatology.  We discussed possible options such as doxycycline orally but she would like to hold off and see dermatology.  She would also like dermatology's opinion on thickened skin on her arms  which requires a lot of exfoliation  From [facility]:  " We need to check a urine pregnancy in 2 weeks-please schedule before you leave.  Please use backup protection for pregnancy with condoms next 2 weeks  This possibly could help with acne but if not we are getting you back up referral to dermatology  We will call you within two weeks about your referral to dermatology. If you do not hear within 3 weeks, give [place] a call.  "  Anxiety-feels like she is in a reasonable place recently and wants to continue without medication or counseling for now   Recommended follow up: Return for CPE with  pap.  Lab/Order associations:   ICD-10-CM   1. Acne vulgaris  L70.0 Ambulatory referral to Dermatology  2. Unprotected sex  Z72.51 POCT urine pregnancy    Meds ordered this encounter  Medications  . levonorgestrel-ethinyl estradiol (AVIANE) 0.1-20 MG-MCG tablet    Sig: Take 1 tablet by mouth daily.    Dispense:  28 tablet    Refill:  12    Time Spent: 25 minutes of total time (1:00 PM- 1:25 PM) was spent on the date of the encounter performing the following actions: chart review prior to seeing the patient, obtaining history, performing a medically necessary exam, counseling on the treatment plan, placing orders, and documenting in our EHR.   Return precautions advised.  Tana Conch, MD

## 2020-01-18 NOTE — Patient Instructions (Addendum)
Health Maintenance Due  Topic Date Due  . Hepatitis C Screening - consider with physical bloodwork Never done  . PAP SMEAR-Modifier -Cervical Cytology Screening will make app for CPE  Never done    Restart birth control pill.  We need to check a urine pregnancy in 2 weeks-please schedule before you leave.  Please use backup protection for pregnancy with condoms next 2 weeks  This possibly could help with acne but if not we are getting you back up referral to dermatology  We will call you within two weeks about your referral to dermatology. If you do not hear within 3 weeks, give [place] a call.

## 2020-01-19 ENCOUNTER — Encounter: Payer: Self-pay | Admitting: Family Medicine

## 2020-01-19 ENCOUNTER — Other Ambulatory Visit: Payer: Self-pay

## 2020-01-19 ENCOUNTER — Ambulatory Visit: Payer: BC Managed Care – PPO | Admitting: Family Medicine

## 2020-01-19 VITALS — BP 120/72 | HR 79 | Temp 98.5°F | Ht 66.0 in | Wt 139.0 lb

## 2020-01-19 DIAGNOSIS — L700 Acne vulgaris: Secondary | ICD-10-CM | POA: Diagnosis not present

## 2020-01-19 DIAGNOSIS — Z7251 High risk heterosexual behavior: Secondary | ICD-10-CM

## 2020-01-19 MED ORDER — LEVONORGESTREL-ETHINYL ESTRAD 0.1-20 MG-MCG PO TABS: 1.0000 | ORAL_TABLET | Freq: Every day | ORAL | 12 refills | Status: DC

## 2020-02-02 ENCOUNTER — Other Ambulatory Visit: Payer: BC Managed Care – PPO

## 2020-03-01 DIAGNOSIS — L858 Other specified epidermal thickening: Secondary | ICD-10-CM | POA: Diagnosis not present

## 2020-03-01 DIAGNOSIS — L700 Acne vulgaris: Secondary | ICD-10-CM | POA: Diagnosis not present

## 2020-05-06 NOTE — Progress Notes (Signed)
Phone [phone]   Subjective:  Patient presents today for their annual physical. Chief complaint-noted.   See problem oriented charting- ROS- full  review of systems was completed and negative except for: neck pain/neckstiffness can lead to headache (may go back to chiropractor)  The following were reviewed and entered/updated in epic: Past Medical History:  Diagnosis Date  . Alcohol abuse    Cut down on her own. drunk on regular basis in past in college. Prescription drug abuse- oxycodone and xanax snorting stopped a year or two after college.   . Former smoker    quit 2016. 1/4 PPD 7 years.   . Migraines    2016 was getting them frequently- doing much better now. no medicine   Patient Active Problem List   Diagnosis Date Noted  . Anxiety 02/21/2017    Priority: High  . Alcohol abuse     Priority: High  . Migraines     Priority: Medium  . Oral contraceptive use 03/21/2015    Priority: Medium  . Former smoker     Priority: Low   Past Surgical History:  Procedure Laterality Date  . WISDOM TOOTH EXTRACTION      Family History  Problem Relation Age of Onset  . Depression Mother        also obese  . Drug abuse Mother        prescription drug abuse  . Arrhythmia Father   . Hyperlipidemia Father   . Peripheral vascular disease Father        carotid artery  . Alcohol abuse Father   . Hypertension Father   . Healthy Sister        half sister  . Alcohol abuse Brother   . Heart attack Maternal Grandmother   . Other Maternal Grandfather        accidental death  . Other Paternal Grandmother        "enlarged heart" never saw doctor  . Colon cancer Paternal Grandmother   . Heart attack Paternal Grandfather        under 50    Medications- reviewed and updated Current Outpatient Medications  Medication Sig Dispense Refill  . doxycycline (VIBRA-TABS) 100 MG tablet Take 100 mg by mouth daily.     No current facility-administered medications for this visit.     Allergies-reviewed and updated No Known Allergies  Social History   Social History Narrative   Single. Lives alone. 2 kitties.       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       Hobbies: youtube and music, enjoys makeup.    Objective  Objective:  BP 112/70   Pulse 71   Temp 98.2 F (36.8 C) (Temporal)   Resp 18   Ht 5\' 6"  (1.676 m)   Wt 137 lb 12.8 oz (62.5 kg)   SpO2 98%   BMI 22.24 kg/m  Gen: NAD, resting comfortably HEENT: Mucous membranes are moist. Oropharynx normal Neck: no thyromegaly CV: RRR no murmurs rubs or gallops Lungs: CTAB no crackles, wheeze, rhonchi Abdomen: soft/nontender/nondistended/normal bowel sounds. No rebound or guarding.  Ext: no edema Skin: warm, dry Neuro: grossly normal, moves all extremities, PERRLA Pelvic: cervix normal in appearance, external genitalia normal, no adnexal masses or tenderness, no cervical motion tenderness, uterus normal size, shape, and consistency and vagina normal with physiologic discharge    Assessment and Plan  29 y.o. female presenting for annual physical.  Health Maintenance counseling: 1. Anticipatory guidance: Patient counseled regarding regular dental exams q6 months, eye exams- yearly,  avoiding smoking and second hand smoke, limiting alcohol to 1 beverage per day.   2. Risk factor reduction:  Advised patient of need for regular exercise and diet rich and fruits and vegetables to reduce risk of heart attack and stroke. Exercise- not recently- encouraged to restart (has a peloton and should use). Diet- doesn't always eat healthiest but watches portions.  Wt Readings from Last 3 Encounters:  05/10/20 137 lb 12.8 oz (62.5 kg)  01/19/20 139 lb (63 kg)  02/20/17 136 lb 6.4 oz (61.9 kg)  3. Immunizations/screenings/ancillary studies- Tdap and flu shot today. HCV screen today.  Immunization History   Administered Date(s) Administered  . Influenza,inj,Quad PF,6+ Mos 05/09/2016  . PFIZER SARS-COV-2 Vaccination 10/01/2019, 10/22/2019  . Tdap 03/11/2010  4. Cervical cancer screening-  Will screen pap smear today 5. Breast cancer screening-  mammogram  Would start at 35 with baseline.  6. Colon cancer screening - PGM colon cancer in 80s- would still start screening at age 38.  7. Skin cancer screening- sees dermatology now for acne- will ask if any concerns. advised regular sunscreen use. Denies worrisome, changing, or new skin lesions.  8. Birth control/STD check-  felt like she was more emotional with birth control pill and stopped but wants to restart as wants to start spironolactone through . Does not want to use contraception but does not want copper IUD Dermatology. doxycyline has been helpful and if misses worsens. Also on tretinoin through dermatology.  -in monogomous relationship- declines std testing 9. Osteoporosis screening at 54- will plan on this -Former smoker  Status of chronic or acute concerns   # mental health-  ? Of bipolar in the past. She feels overall mentally healthy. The times she has not felt stable were related to heavy stress in life or not having directoin- she feels like things are better. Did not feel great on OCP but wants to retrial. Feels naturally anxious- does not feel medication.   Recommended follow up: Return in about 1 year (around 05/10/2021) for physical or sooner if needed.  Lab/Order associations: fasting   ICD-10-CM   1. Preventative health care  Z00.00 Cytology - PAP( [place])    COMPLETE METABOLIC PANEL WITH GFR    CBC with Differential/Platelet    Hepatitis C antibody  2. Anxiety  F41.9 COMPLETE METABOLIC PANEL WITH GFR    CBC with Differential/Platelet  3. Encounter for hepatitis C screening test for low risk patient  Z11.59 Hepatitis C antibody  4. Migraine without aura and without status migrainosus, not intractable  G43.009   5.  Neck pain  M54.2 COMPLETE METABOLIC PANEL WITH GFR    CBC with Differential/Platelet    No orders of the defined types were placed in this encounter.   Return precautions advised.  Tana Conch, MD

## 2020-05-06 NOTE — Patient Instructions (Addendum)
Please stop by lab before you go If you have mychart- we will send your results within 3 business days of [place] receiving them.  If you do not have mychart- we will call you about results within 5 business days of [place] receiving them.  *please note we are currently using [facility] which has a longer processing time than [facility] typically so labs may not come back as quickly as in the past *please also note that you will see labs on mychart as soon as they post. I will later go in and write notes on them- will say "notes from Dr. Durene Cal"  Health Maintenance Due  Topic Date Due  . INFLUENZA VACCINE In office flu shot today 02/07/2020  . TETANUS/TDAP In office tdap vaccine 03/11/2020   We need to check a urine pregnancy in 2 weeks-please schedule before you leave.  Please use backup protection for pregnancy with condoms next 2 weeks

## 2020-05-10 ENCOUNTER — Ambulatory Visit (INDEPENDENT_AMBULATORY_CARE_PROVIDER_SITE_OTHER): Payer: BC Managed Care – PPO | Admitting: Family Medicine

## 2020-05-10 ENCOUNTER — Other Ambulatory Visit: Payer: Self-pay

## 2020-05-10 ENCOUNTER — Other Ambulatory Visit (HOSPITAL_COMMUNITY)
Admission: RE | Admit: 2020-05-10 | Discharge: 2020-05-10 | Disposition: A | Discharge status: Home / Self Care | Payer: BC Managed Care – PPO | Source: Ambulatory Visit | Attending: Family Medicine | Admitting: Family Medicine

## 2020-05-10 ENCOUNTER — Encounter: Payer: Self-pay | Admitting: Family Medicine

## 2020-05-10 VITALS — BP 112/70 | HR 71 | Temp 98.2°F | Resp 18 | Ht 66.0 in | Wt 137.8 lb

## 2020-05-10 DIAGNOSIS — Z0000 Encounter for general adult medical examination without abnormal findings: Secondary | ICD-10-CM

## 2020-05-10 DIAGNOSIS — Z1159 Encounter for screening for other viral diseases: Secondary | ICD-10-CM | POA: Diagnosis not present

## 2020-05-10 DIAGNOSIS — Z23 Encounter for immunization: Secondary | ICD-10-CM

## 2020-05-10 DIAGNOSIS — M542 Cervicalgia: Secondary | ICD-10-CM

## 2020-05-10 DIAGNOSIS — G43009 Migraine without aura, not intractable, without status migrainosus: Secondary | ICD-10-CM | POA: Diagnosis not present

## 2020-05-10 DIAGNOSIS — F419 Anxiety disorder, unspecified: Secondary | ICD-10-CM

## 2020-05-10 DIAGNOSIS — Z79899 Other long term (current) drug therapy: Secondary | ICD-10-CM

## 2020-05-10 NOTE — Addendum Note (Signed)
Addended by: Manuela Schwartz on: 05/10/2020 03:19 PM   Modules accepted: Orders

## 2020-05-11 LAB — CBC WITH DIFFERENTIAL/PLATELET
Absolute Monocytes: 518 cells/uL (ref 200–950)
Basophils Absolute: 70 cells/uL (ref 0–200)
Basophils Relative: 1 %
Eosinophils Absolute: 238 cells/uL (ref 15–500)
Eosinophils Relative: 3.4 %
HCT: 43.6 % (ref 35.0–45.0)
Hemoglobin: 14.7 g/dL (ref 11.7–15.5)
Lymphs Abs: 2240 cells/uL (ref 850–3900)
MCH: 30.4 pg (ref 27.0–33.0)
MCHC: 33.7 g/dL (ref 32.0–36.0)
MCV: 90.1 fL (ref 80.0–100.0)
MPV: 12.4 fL (ref 7.5–12.5)
Monocytes Relative: 7.4 %
Neutro Abs: 3934 cells/uL (ref 1500–7800)
Neutrophils Relative %: 56.2 %
Platelets: 240 10*3/uL (ref 140–400)
RBC: 4.84 10*6/uL (ref 3.80–5.10)
RDW: 12 % (ref 11.0–15.0)
Total Lymphocyte: 32 %
WBC: 7 10*3/uL (ref 3.8–10.8)

## 2020-05-11 LAB — COMPLETE METABOLIC PANEL WITH GFR
AG Ratio: 2 (calc) (ref 1.0–2.5)
ALT: 10 U/L (ref 6–29)
AST: 17 U/L (ref 10–30)
Albumin: 4.6 g/dL (ref 3.6–5.1)
Alkaline phosphatase (APISO): 83 U/L (ref 31–125)
BUN: 9 mg/dL (ref 7–25)
CO2: 23 mmol/L (ref 20–32)
Calcium: 9.7 mg/dL (ref 8.6–10.2)
Chloride: 103 mmol/L (ref 98–110)
Creat: 0.77 mg/dL (ref 0.50–1.10)
GFR, Est African American: 121 mL/min/{1.73_m2} (ref >=60)
GFR, Est Non African American: 104 mL/min/{1.73_m2} (ref >=60)
Globulin: 2.3 g/dL (calc) (ref 1.9–3.7)
Glucose, Bld: 90 mg/dL (ref 65–99)
Potassium: 4.7 mmol/L (ref 3.5–5.3)
Sodium: 140 mmol/L (ref 135–146)
Total Bilirubin: 0.9 mg/dL (ref 0.2–1.2)
Total Protein: 6.9 g/dL (ref 6.1–8.1)

## 2020-05-11 LAB — HEPATITIS C ANTIBODY
Hepatitis C Ab: NONREACTIVE
SIGNAL TO CUT-OFF: 0.02 (ref <1.00)

## 2020-05-12 LAB — CYTOLOGY - PAP: Diagnosis: NEGATIVE

## 2021-05-11 ENCOUNTER — Encounter: Payer: BC Managed Care – PPO | Admitting: Family Medicine

## 2021-11-16 ENCOUNTER — Encounter: Payer: Self-pay | Admitting: Family Medicine

## 2021-11-16 ENCOUNTER — Ambulatory Visit (INDEPENDENT_AMBULATORY_CARE_PROVIDER_SITE_OTHER): Payer: Commercial Indemnity | Admitting: Family Medicine

## 2021-11-16 VITALS — BP 102/80 | HR 60 | Temp 98.3°F | Ht 66.0 in | Wt 144.8 lb

## 2021-11-16 DIAGNOSIS — F419 Anxiety disorder, unspecified: Secondary | ICD-10-CM

## 2021-11-16 DIAGNOSIS — Z1211 Encounter for screening for malignant neoplasm of colon: Secondary | ICD-10-CM

## 2021-11-16 DIAGNOSIS — L709 Acne, unspecified: Secondary | ICD-10-CM

## 2021-11-16 DIAGNOSIS — K625 Hemorrhage of anus and rectum: Secondary | ICD-10-CM

## 2021-11-16 DIAGNOSIS — Z0000 Encounter for general adult medical examination without abnormal findings: Secondary | ICD-10-CM

## 2021-11-16 DIAGNOSIS — Z87891 Personal history of nicotine dependence: Secondary | ICD-10-CM | POA: Diagnosis not present

## 2021-11-16 LAB — POC URINALSYSI DIPSTICK (AUTOMATED)
Bilirubin, UA: NEGATIVE
Blood, UA: NEGATIVE
Glucose, UA: NEGATIVE
Ketones, UA: NEGATIVE
Leukocytes, UA: NEGATIVE
Nitrite, UA: NEGATIVE
Protein, UA: NEGATIVE
Spec Grav, UA: 1.02 (ref 1.010–1.025)
Urobilinogen, UA: 0.2 E.U./dL
pH, UA: 6 (ref 5.0–8.0)

## 2021-11-16 MED ORDER — LEVONORGESTREL-ETHINYL ESTRAD 0.1-20 MG-MCG PO TABS: 1.0000 | ORAL_TABLET | Freq: Every day | ORAL | 12 refills | Status: DC

## 2021-11-16 NOTE — Patient Instructions (Addendum)
We will call you within two weeks about your referral to dermatology. If you do not hear within 2 weeks, give [place] a call.  ? ?Team please make sure she gets stool cards ? ?Restart birth control  ? ?Please stop by lab before you go ?If you have mychart- we will send your results within 3 business days of [place] receiving them.  ?If you do not have mychart- we will call you about results within 5 business days of [place] receiving them.  ?*please also note that you will see labs on mychart as soon as they post. I will later go in and write notes on them- will say "notes from Dr. Durene Cal"  ? ?Recommended follow up: Return in about 1 year (around 11/17/2022) for physical or sooner if needed.Schedule b4 you leave. ?

## 2021-11-16 NOTE — Progress Notes (Signed)
?Phone [phone] ?  ?Subjective:  ?Patient presents today for their annual physical. Chief complaint-noted.  ? ?See problem oriented charting- ?ROS- full  review of systems was completed and negative ?except for: neck pain related to stress (chiropractor helps), some blood on toilet paper when wiping after straining minimal like 4-5 x per year ? ?The following were reviewed and entered/updated in epic: ?Past Medical History:  ?Diagnosis Date  ? Alcohol abuse   ? Cut down on her own. drunk on regular basis in past in college. Prescription drug abuse- oxycodone and xanax snorting stopped a year or two after college.   ? Former smoker   ? quit 2016. 1/4 PPD 7 years.   ? Migraines   ? 2016 was getting them frequently- doing much better now. no medicine  ? ?Patient Active Problem List  ? Diagnosis Date Noted  ? Anxiety 02/21/2017  ?  Priority: High  ? Alcohol abuse   ?  Priority: High  ? Migraines   ?  Priority: Medium   ? Oral contraceptive use 03/21/2015  ?  Priority: Medium   ? Former smoker   ?  Priority: Low  ? ?Past Surgical History:  ?Procedure Laterality Date  ? WISDOM TOOTH EXTRACTION    ? ? ?Family History  ?Problem Relation Age of Onset  ? Depression Mother   ?     also obese  ? Drug abuse Mother   ?     prescription drug abuse  ? Arrhythmia Father   ? Hyperlipidemia Father   ? Peripheral vascular disease Father   ?     carotid artery  ? Alcohol abuse Father   ? Hypertension Father   ? Healthy Sister   ?     half sister  ? Alcohol abuse Brother   ? Heart attack Maternal Grandmother   ? Other Maternal Grandfather   ?     accidental death  ? Other Paternal Grandmother   ?     "enlarged heart" never saw doctor  ? Colon cancer Paternal Grandmother   ? Heart attack Paternal Grandfather   ?     under 50  ? ? ?Medications- reviewed and updated ?Current Outpatient Medications  ?Medication Sig Dispense Refill  ? levonorgestrel-ethinyl estradiol (AVIANE) 0.1-20 MG-MCG tablet Take 1 tablet by mouth daily. 28 tablet  12  ? ?No current facility-administered medications for this visit.  ? ? ?Allergies-reviewed and updated ?No Known Allergies ? ?Social History  ? ?Social History Narrative  ? Single. Lives alone. 1 cat.   ?   ? 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   ? MBA at [employer] finished dec 2022  ? Went to [employer]- english major-  ?   ? Hobbies: youtube and music, enjoys makeup.   ? ?Objective  ?Objective:  ?BP 102/80   Pulse 60   Temp 98.3 ?F (36.8 ?C)   Ht 5\' 6"  (1.676 m)   Wt 144 lb 12.8 oz (65.7 kg)   SpO2 100%   BMI 23.37 kg/m?  ?Gen: NAD, resting comfortably ?HEENT: Mucous membranes are moist. Oropharynx normal ?Neck: no thyromegaly ?CV: RRR no murmurs rubs or gallops ?Lungs: CTAB no crackles, wheeze, rhonchi ?Abdomen: soft/nontender/nondistended/normal bowel sounds. No rebound or guarding.  ?Ext: no edema ?Skin: warm, dry ?Neuro: grossly normal, moves all extremities, PERRLA ?  ?Assessment and Plan  ? ?31 y.o. female presenting for annual physical.  ?Health Maintenance counseling: ?1. Anticipatory guidance: Patient counseled regarding regular dental exams - q6 months, eye  exams - trying to get updated exam- wears contacts,  avoiding smoking and second hand smoke , limiting alcohol to 1 beverage per day- doing well for most part with this- history of drinking more heavily in past but cut back on her own , no illicit drugs - occasionally people smoke around her.   ?2. Risk factor reduction:  Advised patient of need for regular exercise and diet rich and fruits and vegetables to reduce risk of heart attack and stroke.  ?Exercise- ups and downs. Doing either running or walking 45 mins. Occasionally gets on bike trails. Doesn't use peloton though she has it.  ?Diet/weight management-weight up 7 lbs from last CPE- still healthy BMI. Could improve diet  ?Wt Readings from Last 3 Encounters:  ?11/16/21 144 lb 12.8 oz (65.7 kg)  ?05/10/20 137 lb 12.8 oz (62.5 kg)  ?01/19/20 139 lb (63 kg)  ?3.  Immunizations/screenings/ancillary studies- wants to hold off on covid vaccine ?Immunization History  ?Administered Date(s) Administered  ? Influenza,inj,Quad PF,6+ Mos 05/09/2016, 05/10/2020  ? PFIZER(Purple Top)SARS-COV-2 Vaccination 10/01/2019, 10/22/2019  ? Tdap 03/11/2010, 05/10/2020  ? 4. Cervical cancer screening- 05/10/20 normal with 3 year repeat planned- next year ?5. Breast cancer screening-  breast exam - encouraged self exams- and mammogram - recommend start annual at 40no no family history ?6. Colon cancer screening -  no 1st degree family history (PGM in 3s colon cancer)family history, start at age 68. Will get stool cards but sounds like rectal bleeding hemorrhoid or fissure related (high pain tolerance she states) ?7. Skin cancer screening- has seen derm in past for acne- wants to get plugged back in for this. advised regular sunscreen use. Denies worrisome, changing, or new skin lesions.  ?8. Birth control/STD check- with boyfriend monogamous- declines std screening.  ?9. Osteoporosis screening at 83- will plan on this ?10. Smoking associated screening - former smoker ? ?Status of chronic or acute concerns  ? ?#Birth control- wants to restart aviane- just finished cycle 4 days ago.  ? ?#screening hyperlipidemia- done 2017- wants to hold and recheck with labs next year ? ?Recommended follow up: Return in about 1 year (around 11/17/2022) for physical or sooner if needed.Schedule b4 you leave. ? ?Lab/Order associations: fasting ?  ICD-10-CM   ?1. Preventative health care  Z00.00   ?  ?2. Anxiety  F41.9   ?  ?3. Acne, unspecified acne type  L70.9 Ambulatory referral to Dermatology  ?  ?4. Screen for colon cancer  Z12.11 Fecal occult blood, imunochemical  ?  ?5. Rectal bleeding  K62.5 Fecal occult blood, imunochemical  ?  ?6. Former smoker  Z87.891 POCT Urinalysis Dipstick (Automated)  ?  ? ?Meds ordered this encounter  ?Medications  ? levonorgestrel-ethinyl estradiol (AVIANE) 0.1-20 MG-MCG tablet  ?   Sig: Take 1 tablet by mouth daily.  ?  Dispense:  28 tablet  ?  Refill:  12  ? ?Return precautions advised.  ?Tana Conch, MD ? ? ?

## 2021-11-22 ENCOUNTER — Other Ambulatory Visit (INDEPENDENT_AMBULATORY_CARE_PROVIDER_SITE_OTHER): Payer: Commercial Indemnity

## 2021-11-22 DIAGNOSIS — Z1211 Encounter for screening for malignant neoplasm of colon: Secondary | ICD-10-CM

## 2021-11-22 DIAGNOSIS — K625 Hemorrhage of anus and rectum: Secondary | ICD-10-CM | POA: Diagnosis not present

## 2021-11-22 LAB — FECAL OCCULT BLOOD, IMMUNOCHEMICAL: Fecal Occult Bld: NEGATIVE

## 2022-06-20 ENCOUNTER — Ambulatory Visit: Payer: Commercial Indemnity

## 2022-06-21 ENCOUNTER — Encounter: Payer: Self-pay | Admitting: *Deleted

## 2022-06-21 ENCOUNTER — Ambulatory Visit
Admission: EM | Admit: 2022-06-21 | Discharge: 2022-06-21 | Disposition: A | Discharge status: Home / Self Care | Payer: Managed Care, Other (non HMO) | Attending: Urgent Care | Admitting: Urgent Care

## 2022-06-21 ENCOUNTER — Other Ambulatory Visit: Payer: Self-pay

## 2022-06-21 DIAGNOSIS — B349 Viral infection, unspecified: Secondary | ICD-10-CM

## 2022-06-21 MED ORDER — IBUPROFEN 600 MG PO TABS: 600.0000 mg | ORAL_TABLET | Freq: Four times a day (QID) | ORAL | 0 refills | Status: DC | PRN

## 2022-06-21 MED ORDER — PSEUDOEPHEDRINE HCL 60 MG PO TABS: 60.0000 mg | ORAL_TABLET | Freq: Three times a day (TID) | ORAL | 0 refills | Status: DC | PRN

## 2022-06-21 MED ORDER — CETIRIZINE HCL 10 MG PO TABS: 10.0000 mg | ORAL_TABLET | Freq: Every day | ORAL | 0 refills | Status: DC

## 2022-06-21 NOTE — ED Provider Notes (Signed)
[facility]  Note:  This document was prepared using Systems analyst and may include unintentional dictation errors.  MRN: [id number] DOB: 07-04-1991  Subjective:   Heidi Long is a 31 y.o. female presenting for 2 to 3-day history of acute onset persistent sinus congestion, sinus pressure and drainage.  No fever, cough, chest pain, shortness of breath, sinus pain, ear pain, throat pain, painful swallowing.  Has had some exposure to strep but does not feel like that is the primary issue.  She has been around a lot of sick coworkers.  Did a COVID test at home and was negative.  Had flu last year and reports that it feels very different.  Just wanted to make sure that she got evaluated in our clinic.  No current facility-administered medications for this encounter.  Current Outpatient Medications:    levonorgestrel-ethinyl estradiol (AVIANE) 0.1-20 MG-MCG tablet, Take 1 tablet by mouth daily., Disp: 28 tablet, Rfl: 12   No Known Allergies  Past Medical History:  Diagnosis Date   Alcohol abuse    Cut down on her own. drunk on regular basis in past in college. Prescription drug abuse- oxycodone and xanax snorting stopped a year or two after college.    Former smoker    quit 2016. 1/4 PPD 7 years.    Migraines    2016 was getting them frequently- doing much better now. no medicine     Past Surgical History:  Procedure Laterality Date   WISDOM TOOTH EXTRACTION      Family History  Problem Relation Age of Onset   Depression Mother        also obese   Drug abuse Mother        prescription drug abuse   Arrhythmia Father    Hyperlipidemia Father    Peripheral vascular disease Father        carotid artery   Alcohol abuse Father    Hypertension Father    Healthy Sister        half sister   Alcohol abuse Brother    Heart attack Maternal Grandmother    Other Maternal Grandfather        accidental death   Other Paternal Grandmother         "enlarged heart" never saw doctor   Colon cancer Paternal Grandmother    Heart attack Paternal Grandfather        under 47    Social History   Tobacco Use   Smoking status: Former    Packs/day: 0.25    Years: 6.00    Total pack years: 1.50    Types: Cigarettes    Quit date: 05/30/2015    Years since quitting: 7.0   Smokeless tobacco: Never  Vaping Use   Vaping Use: Never used  Substance Use Topics   Alcohol use: Not Currently    Comment: weekly   Drug use: Yes    Types: Marijuana    ROS   Objective:   Vitals: BP 126/88 (BP Location: Left Arm)   Pulse 75   Temp 98.4 F (36.9 C) (Oral)   Resp 16   LMP 06/01/2022   SpO2 98%   Physical Exam Constitutional:      General: She is not in acute distress.    Appearance: Normal appearance. She is well-developed and normal weight. She is not ill-appearing, toxic-appearing or diaphoretic.  HENT:     Head: Normocephalic and atraumatic.     Right Ear: Tympanic  membrane, ear canal and external ear normal. No drainage or tenderness. No middle ear effusion. There is no impacted cerumen. Tympanic membrane is not erythematous or bulging.     Left Ear: Tympanic membrane, ear canal and external ear normal. No drainage or tenderness.  No middle ear effusion. There is no impacted cerumen. Tympanic membrane is not erythematous or bulging.     Nose: Congestion and rhinorrhea present.     Mouth/Throat:     Mouth: Mucous membranes are moist. No oral lesions.     Pharynx: No pharyngeal swelling, oropharyngeal exudate, posterior oropharyngeal erythema or uvula swelling.     Tonsils: No tonsillar exudate or tonsillar abscesses.  Eyes:     General: No scleral icterus.       Right eye: No discharge.        Left eye: No discharge.     Extraocular Movements: Extraocular movements intact.     Right eye: Normal extraocular motion.     Left eye: Normal extraocular motion.     Conjunctiva/sclera: Conjunctivae normal.  Cardiovascular:      Rate and Rhythm: Normal rate.  Pulmonary:     Effort: Pulmonary effort is normal.  Musculoskeletal:     Cervical back: Normal range of motion and neck supple.  Lymphadenopathy:     Cervical: No cervical adenopathy.  Skin:    General: Skin is warm and dry.  Neurological:     General: No focal deficit present.     Mental Status: She is alert and oriented to person, place, and time.  Psychiatric:        Mood and Affect: Mood normal.        Behavior: Behavior normal.     Assessment and Plan :   PDMP not reviewed this encounter.  1. Acute viral syndrome     Patient declined testing and I am in agreement. Suspect viral URI, viral syndrome. Physical exam findings reassuring and vital signs stable for discharge. Advised supportive care, offered symptomatic relief. Counseled patient on potential for adverse effects with medications prescribed/recommended today, ER and return-to-clinic precautions discussed, patient verbalized understanding.     Wallis Bamberg, PA-C 06/21/22 1756

## 2022-06-21 NOTE — ED Triage Notes (Signed)
Pt c/o nasal congestion, sinus pressure stared yesterday-denies fever/cough-NAD-steady gait

## 2022-07-06 ENCOUNTER — Telehealth: Payer: Managed Care, Other (non HMO) | Admitting: Family Medicine

## 2022-07-06 DIAGNOSIS — N3000 Acute cystitis without hematuria: Secondary | ICD-10-CM | POA: Diagnosis not present

## 2022-07-06 MED ORDER — SULFAMETHOXAZOLE-TRIMETHOPRIM 800-160 MG PO TABS: 1.0000 | ORAL_TABLET | Freq: Two times a day (BID) | ORAL | 0 refills | Status: AC

## 2022-07-06 NOTE — Patient Instructions (Signed)
Urinary Tract Infection, Adult  A urinary tract infection (UTI) is an infection of any part of the urinary tract. The urinary tract includes the kidneys, ureters, bladder, and urethra. These organs make, store, and get rid of urine in the body. An upper UTI affects the ureters and kidneys. A lower UTI affects the bladder and urethra. What are the causes? Most urinary tract infections are caused by bacteria in your genital area around your urethra, where urine leaves your body. These bacteria grow and cause inflammation of your urinary tract. What increases the risk? You are more likely to develop this condition if: You have a urinary catheter that stays in place. You are not able to control when you urinate or have a bowel movement (incontinence). You are female and you: Use a spermicide or diaphragm for birth control. Have low estrogen levels. Are pregnant. You have certain genes that increase your risk. You are sexually active. You take antibiotic medicines. You have a condition that causes your flow of urine to slow down, such as: An enlarged prostate, if you are female. Blockage in your urethra. A kidney stone. A nerve condition that affects your bladder control (neurogenic bladder). Not getting enough to drink, or not urinating often. You have certain medical conditions, such as: Diabetes. A weak disease-fighting system (immunesystem). Sickle cell disease. Gout. Spinal cord injury. What are the signs or symptoms? Symptoms of this condition include: Needing to urinate right away (urgency). Frequent urination. This may include small amounts of urine each time you urinate. Pain or burning with urination. Blood in the urine. Urine that smells bad or unusual. Trouble urinating. Cloudy urine. Vaginal discharge, if you are female. Pain in the abdomen or the lower back. You may also have: Vomiting or a decreased appetite. Confusion. Irritability or tiredness. A fever or  chills. Diarrhea. The first symptom in older adults may be confusion. In some cases, they may not have any symptoms until the infection has worsened. How is this diagnosed? This condition is diagnosed based on your medical history and a physical exam. You may also have other tests, including: Urine tests. Blood tests. Tests for STIs (sexually transmitted infections). If you have had more than one UTI, a cystoscopy or imaging studies may be done to determine the cause of the infections. How is this treated? Treatment for this condition includes: Antibiotic medicine. Over-the-counter medicines to treat discomfort. Drinking enough water to stay hydrated. If you have frequent infections or have other conditions such as a kidney stone, you may need to see a health care provider who specializes in the urinary tract (urologist). In rare cases, urinary tract infections can cause sepsis. Sepsis is a life-threatening condition that occurs when the body responds to an infection. Sepsis is treated in the hospital with IV antibiotics, fluids, and other medicines. Follow these instructions at home:  Medicines Take over-the-counter and prescription medicines only as told by your health care provider. If you were prescribed an antibiotic medicine, take it as told by your health care provider. Do not stop using the antibiotic even if you start to feel better. General instructions Make sure you: Empty your bladder often and completely. Do not hold urine for long periods of time. Empty your bladder after sex. Wipe from front to back after urinating or having a bowel movement if you are female. Use each tissue only one time when you wipe. Drink enough fluid to keep your urine pale yellow. Keep all follow-up visits. This is important. Contact a health   care provider if: Your symptoms do not get better after 1-2 days. Your symptoms go away and then return. Get help right away if: You have severe pain in  your back or your lower abdomen. You have a fever or chills. You have nausea or vomiting. Summary A urinary tract infection (UTI) is an infection of any part of the urinary tract, which includes the kidneys, ureters, bladder, and urethra. Most urinary tract infections are caused by bacteria in your genital area. Treatment for this condition often includes antibiotic medicines. If you were prescribed an antibiotic medicine, take it as told by your health care provider. Do not stop using the antibiotic even if you start to feel better. Keep all follow-up visits. This is important. This information is not intended to replace advice given to you by your health care provider. Make sure you discuss any questions you have with your health care provider. Document Revised: 02/05/2020 Document Reviewed: 02/05/2020 Elsevier Patient Education  2023 Elsevier Inc.  

## 2022-07-06 NOTE — Progress Notes (Signed)
Virtual Visit Consent   Heidi Long, you are scheduled for a virtual visit with a [facility] provider today. Just as with appointments in the office, your consent must be obtained to participate. Your consent will be active for this visit and any virtual visit you may have with one of our providers in the next 365 days. If you have a MyChart account, a copy of this consent can be sent to you electronically.  As this is a virtual visit, video technology does not allow for your provider to perform a traditional examination. This may limit your provider's ability to fully assess your condition. If your provider identifies any concerns that need to be evaluated in person or the need to arrange testing (such as labs, EKG, etc.), we will make arrangements to do so. Although advances in technology are sophisticated, we cannot ensure that it will always work on either your end or our end. If the connection with a video visit is poor, the visit may have to be switched to a telephone visit. With either a video or telephone visit, we are not always able to ensure that we have a secure connection.  By engaging in this virtual visit, you consent to the provision of healthcare and authorize for your insurance to be billed (if applicable) for the services provided during this visit. Depending on your insurance coverage, you may receive a charge related to this service.  I need to obtain your verbal consent now. Are you willing to proceed with your visit today? Heidi Long has provided verbal consent on 07/06/2022 for a virtual visit (video or telephone). Heidi Curio, FNP  Date: 07/06/2022 4:50 PM  Virtual Visit via Video Note   I, Heidi Long, connected with  Heidi Long  ([id number], Nov 25, 1990) on 07/06/22 at  4:30 PM EST by a video-enabled telemedicine application and verified that I am speaking with the correct person using two identifiers.  Location: Patient: Virtual Visit Location  Patient: Home Provider: Virtual Visit Location Provider: Home Office   I discussed the limitations of evaluation and management by telemedicine and the availability of in person appointments. The patient expressed understanding and agreed to proceed.    History of Present Illness: Heidi Long is a 31 y.o. who identifies as a female who was assigned female at birth, and is being seen today for burning and frequency with urination. No fever, chills or abd pain. Sx for 1 day. Taking AZO. Marland Kitchen  HPI: HPI  Problems:  Patient Active Problem List   Diagnosis Date Noted   Anxiety 02/21/2017   Former smoker    Migraines    Alcohol abuse    Oral contraceptive use 03/21/2015    Allergies: No Known Allergies Medications:  Current Outpatient Medications:    cetirizine (ZYRTEC ALLERGY) 10 MG tablet, Take 1 tablet (10 mg total) by mouth daily., Disp: 30 tablet, Rfl: 0   ibuprofen (ADVIL) 600 MG tablet, Take 1 tablet (600 mg total) by mouth every 6 (six) hours as needed., Disp: 30 tablet, Rfl: 0   levonorgestrel-ethinyl estradiol (AVIANE) 0.1-20 MG-MCG tablet, Take 1 tablet by mouth daily., Disp: 28 tablet, Rfl: 12   pseudoephedrine (SUDAFED) 60 MG tablet, Take 1 tablet (60 mg total) by mouth every 8 (eight) hours as needed for congestion., Disp: 30 tablet, Rfl: 0  Observations/Objective: Patient is well-developed, well-nourished in no acute distress.  Resting comfortably  at home.  Head is normocephalic, atraumatic.  No labored breathing.  Speech is clear  and coherent with logical content.  Patient is alert and oriented at baseline.    Assessment and Plan: 1. Acute cystitis without hematuria  Increase fluids, preventative measures discussed, urgent care if sx worsen.   Follow Up Instructions: I discussed the assessment and treatment plan with the patient. The patient was provided an opportunity to ask questions and all were answered. The patient agreed with the plan and demonstrated an  understanding of the instructions.  A copy of instructions were sent to the patient via MyChart unless otherwise noted below.     The patient was advised to call back or seek an in-person evaluation if the symptoms worsen or if the condition fails to improve as anticipated.  Time:  I spent 10 minutes with the patient via telehealth technology discussing the above problems/concerns.    Heidi Curio, FNP

## 2022-10-25 ENCOUNTER — Ambulatory Visit (INDEPENDENT_AMBULATORY_CARE_PROVIDER_SITE_OTHER): Payer: Managed Care, Other (non HMO) | Admitting: Family Medicine

## 2022-10-25 VITALS — BP 120/80 | HR 76 | Temp 98.2°F | Ht 66.0 in | Wt 143.0 lb

## 2022-10-25 DIAGNOSIS — Z13220 Encounter for screening for lipoid disorders: Secondary | ICD-10-CM

## 2022-10-25 DIAGNOSIS — F419 Anxiety disorder, unspecified: Secondary | ICD-10-CM

## 2022-10-25 MED ORDER — BUSPIRONE HCL 5 MG PO TABS: 5.0000 mg | ORAL_TABLET | Freq: Two times a day (BID) | ORAL | 5 refills | Status: DC

## 2022-10-25 NOTE — Progress Notes (Signed)
Phone [phone] In person visit   Subjective:   Heidi Long is a 32 y.o. year old very pleasant female patient who presents for/with See problem oriented charting Chief Complaint  Patient presents with   Anxiety    Requesting Buspirone.   Sore and dry throat    Slightly when swallowing for a few days. Has had some improvement since taking Nyquil yesterday. Thinks she may have allergies.   Tongue is white    Past Medical History-  Patient Active Problem List   Diagnosis Date Noted   Anxiety 02/21/2017    Priority: High   Alcohol abuse     Priority: High   Migraines     Priority: Medium    Oral contraceptive use 03/21/2015    Priority: Medium    Former smoker     Priority: Low    Medications- reviewed and updated Current Outpatient Medications  Medication Sig Dispense Refill   busPIRone (BUSPAR) 5 MG tablet Take 1 tablet (5 mg total) by mouth 2 (two) times daily. 60 tablet 5   levonorgestrel-ethinyl estradiol (AVIANE) 0.1-20 MG-MCG tablet Take 1 tablet by mouth daily. 28 tablet 12   spironolactone (ALDACTONE) 50 MG tablet TAKE 3 TABLET BY MOUTH EVERY DAY FOR 30 DAYS Orally Once a day for 30 days     No current facility-administered medications for this visit.     Objective:  BP 120/80 (BP Location: Left Arm, Patient Position: Sitting)   Pulse 76   Temp 98.2 F (36.8 C) (Temporal)   Ht  (1.676 m)   Wt 143 lb (64.9 kg)   SpO2 99%   BMI 23.08 kg/m  Gen: NAD, resting comfortably Regular rate and rhythm Clear to auscultation bilaterally No edema    Assessment and Plan   # Anxiety S: Medication:none -at times feels a lot of pressure with work or caring for people- pressure to appease parents for instance.  -best friend moved away so doesn't have same outlet - interested in seeing therapist -tried some old buspirone and felt some fatigue but did feel better anxiety wise- taking once a day in evening  -19-20 bipolar diagnosis in past but this  was also beginning of college and still in time of processing parents divorce from when she was under 10- never told same diagnosis by another provider and felt no benefit on Lamictal prescribed at time. No recent drug or alcohol abuse reported..  No obvious hypomania or mania    10/25/2022    2:49 PM 10/25/2022    2:16 PM 11/16/2021    7:55 AM  Depression screen PHQ 2/9  Decreased Interest 0 0 0  Down, Depressed, Hopeless 0 0 0  PHQ - 2 Score 0 0 0  Altered sleeping 3  0  Tired, decreased energy 3  0  Change in appetite 0  0  Feeling bad or failure about yourself  3  0  Trouble concentrating 1  0  Moving slowly or fidgety/restless 1  0  Suicidal thoughts 0  0  PHQ-9 Score 11  0  Difficult doing work/chores Not difficult at all  Not difficult at all      10/25/2022    2:51 PM  GAD 7 : Generalized Anxiety Score  Nervous, Anxious, on Edge 3  Control/stop worrying 3  Worry too much - different things 3  Trouble relaxing 3  Restless 3  Easily annoyed or irritable 3  Afraid - awful might happen 3  Total GAD 7 Score  21  Anxiety Difficulty Not difficult at all  A/P: Anxiety is worsening from last visit.  Had benefit in the past with buspirone and wants to retrial at this time-last used in 2018 or so.  No anhedonia or depressed mood but does have significant sleep and fatigue issues.  Still very high functioning at work - Does feel some boundary issues with her mother and does not feel has fully processed her parents divorce from her childhood-we will add therapy-referral made -With worsening check CBC, CMP, TSH (also on spironolactone for acne and reports not having potassium checked-as a side note certainly possible spironolactone is worsening anxiety but we did not specifically discuss this today-I will see her back next month so we can see how she is doing) -Wonder if something like EMDR or eye spotting would be a good choice for her in particular  -Buspirone should be low risk for  inducing mania or hypomania if she truly does have underlying bipolar but I am not confident in this diagnosis  # Screening hyperlipidemia-since we did labs today we will also go ahead and update her lipid panel-should not need labs at time of her physical  Recommended follow up: Return for next already scheduled visit or sooner if needed. Future Appointments  Date Time Provider Department Center  11/19/2022  8:00 AM Shelva Majestic, MD [facility]    Lab/Order associations:   ICD-10-CM   1. Anxiety  F41.9 Ambulatory referral to Psychology    CBC with Differential/Platelet    Comprehensive metabolic panel    TSH    2. Screening for hyperlipidemia  Z13.220 Lipid panel      Meds ordered this encounter  Medications   busPIRone (BUSPAR) 5 MG tablet    Sig: Take 1 tablet (5 mg total) by mouth 2 (two) times daily.    Dispense:  60 tablet    Refill:  5    Time Spent: 38 minutes of total time (2:40 PM- 3:18 PM) was spent on the date of the encounter performing the following actions: chart review prior to seeing the patient, obtaining history,  counseling on the treatment plan including both therapy and medication options, placing orders, and documenting in our EHR.    Return precautions advised.  Tana Conch, MD

## 2022-10-25 NOTE — Assessment & Plan Note (Addendum)
#   Anxiety S: Medication:none -at times feels a lot of pressure with work or caring for people- pressure to appease parents for instance.  -best friend moved away so doesn't have same outlet - interested in seeing therapist -tried some old buspirone and felt some fatigue but did feel better anxiety wise- taking once a day in evening  -19-20 bipolar diagnosis in past but this was also beginning of college and still in time of processing parents divorce from when she was under 10- never told same diagnosis by another provider and felt no benefit on Lamictal prescribed at time. No recent drug or alcohol abuse reported.     10/25/2022    2:49 PM 10/25/2022    2:16 PM 11/16/2021    7:55 AM  Depression screen PHQ 2/9  Decreased Interest 0 0 0  Down, Depressed, Hopeless 0 0 0  PHQ - 2 Score 0 0 0  Altered sleeping 3  0  Tired, decreased energy 3  0  Change in appetite 0  0  Feeling bad or failure about yourself  3  0  Trouble concentrating 1  0  Moving slowly or fidgety/restless 1  0  Suicidal thoughts 0  0  PHQ-9 Score 11  0  Difficult doing work/chores Not difficult at all  Not difficult at all      10/25/2022    2:51 PM  GAD 7 : Generalized Anxiety Score  Nervous, Anxious, on Edge 3  Control/stop worrying 3  Worry too much - different things 3  Trouble relaxing 3  Restless 3  Easily annoyed or irritable 3  Afraid - awful might happen 3  Total GAD 7 Score 21  Anxiety Difficulty Not difficult at all  A/P: Anxiety is worsening from last visit.  Had benefit in the past with buspirone and wants to retrial at this time-last used in 2018 or so.  No anhedonia or depressed mood but does have significant sleep and fatigue issues.  Still very high functioning at work - Does feel some boundary issues with her mother and does not feel has fully processed her parents divorce from her childhood-we will add therapy-referral made -With worsening check CBC, CMP, TSH (also on spironolactone for acne  and reports not having potassium checked-as a side note certainly possible spironolactone is worsening anxiety but we did not specifically discuss this today-I will see her back next month so we can see how she is doing) -Buspirone should be low risk for inducing mania or hypomania if she truly does have underlying bipolar but I am not confident in this diagnosis

## 2022-10-25 NOTE — Patient Instructions (Addendum)
Please call [phone] to schedule a visit with [facility] - please tell the office you were directly referred by Dr. Sheppard Plumber sometimes can be backed up for up to 2 months so here are some other options to check in on: - [email or web address]  at  [phone] [facility] [email or web address] at [phone]  Please stop by lab before you go If you have mychart- we will send your results within 3 business days of [place] receiving them.  If you do not have mychart- we will call you about results within 5 business days of [place] receiving them.  *please also note that you will see labs on mychart as soon as they post. I will later go in and write notes on them- will say "notes from Dr. Durene Cal"   Buspirone 5 mg twice daily- can start with once a day or even half tablet twice a day or once a day  Pretty please use peleton goal at least 5 days a week for 30 minutes   Recommended follow up: Return for next already scheduled visit or sooner if needed.

## 2022-10-26 ENCOUNTER — Encounter: Payer: Self-pay | Admitting: Emergency Medicine

## 2022-10-26 ENCOUNTER — Ambulatory Visit
Admission: EM | Admit: 2022-10-26 | Discharge: 2022-10-26 | Disposition: A | Discharge status: Home / Self Care | Payer: Managed Care, Other (non HMO) | Attending: Internal Medicine | Admitting: Internal Medicine

## 2022-10-26 DIAGNOSIS — J029 Acute pharyngitis, unspecified: Secondary | ICD-10-CM | POA: Diagnosis present

## 2022-10-26 LAB — CBC WITH DIFFERENTIAL/PLATELET
Basophils Absolute: 0.1 10*3/uL (ref 0.0–0.1)
Basophils Relative: 0.9 % (ref 0.0–3.0)
Eosinophils Absolute: 0.2 10*3/uL (ref 0.0–0.7)
Eosinophils Relative: 2.4 % (ref 0.0–5.0)
HCT: 41.3 % (ref 36.0–46.0)
Hemoglobin: 14.1 g/dL (ref 12.0–15.0)
Lymphocytes Relative: 18.1 % (ref 12.0–46.0)
Lymphs Abs: 1.9 10*3/uL (ref 0.7–4.0)
MCHC: 34.1 g/dL (ref 30.0–36.0)
MCV: 92.2 fl (ref 78.0–100.0)
Monocytes Absolute: 0.7 10*3/uL (ref 0.1–1.0)
Monocytes Relative: 6.8 % (ref 3.0–12.0)
Neutro Abs: 7.4 10*3/uL (ref 1.4–7.7)
Neutrophils Relative %: 71.8 % (ref 43.0–77.0)
Platelets: 229 10*3/uL (ref 150.0–400.0)
RBC: 4.48 Mil/uL (ref 3.87–5.11)
RDW: 13 % (ref 11.5–15.5)
WBC: 10.3 10*3/uL (ref 4.0–10.5)

## 2022-10-26 LAB — POCT MONO SCREEN (KUC): Mono, POC: NEGATIVE

## 2022-10-26 LAB — COMPREHENSIVE METABOLIC PANEL
ALT: 25 U/L (ref 0–35)
AST: 20 U/L (ref 0–37)
Albumin: 4.3 g/dL (ref 3.5–5.2)
Alkaline Phosphatase: 74 U/L (ref 39–117)
BUN: 9 mg/dL (ref 6–23)
CO2: 29 mEq/L (ref 19–32)
Calcium: 9.4 mg/dL (ref 8.4–10.5)
Chloride: 103 mEq/L (ref 96–112)
Creatinine, Ser: 0.8 mg/dL (ref 0.40–1.20)
GFR: 98.04 mL/min (ref >60.00)
Glucose, Bld: 77 mg/dL (ref 70–99)
Potassium: 4.2 mEq/L (ref 3.5–5.1)
Sodium: 138 mEq/L (ref 135–145)
Total Bilirubin: 0.4 mg/dL (ref 0.2–1.2)
Total Protein: 6.8 g/dL (ref 6.0–8.3)

## 2022-10-26 LAB — POCT RAPID STREP A (OFFICE): Rapid Strep A Screen: NEGATIVE

## 2022-10-26 LAB — LIPID PANEL
Cholesterol: 212 mg/dL — ABNORMAL HIGH (ref 0–200)
HDL: 70.9 mg/dL (ref >39.00)
LDL Cholesterol: 107 mg/dL — ABNORMAL HIGH (ref 0–99)
NonHDL: 140.76
Total CHOL/HDL Ratio: 3
Triglycerides: 170 mg/dL — ABNORMAL HIGH (ref 0.0–149.0)
VLDL: 34 mg/dL (ref 0.0–40.0)

## 2022-10-26 LAB — TSH: TSH: 1.14 u[IU]/mL (ref 0.35–5.50)

## 2022-10-26 NOTE — ED Provider Notes (Signed)
[facility]    CSN: [id number] Arrival date & time: 10/26/22  1710      History   Chief Complaint Chief Complaint  Patient presents with   Sore Throat    HPI Heidi Long is a 32 y.o. female comes to urgent care with sore throat of 2 days duration.  Onset was fairly rapid.  Pain is of moderate severity and aggravated by swallowing.  Pain was relieved by taking NyQuil.  No nausea, vomiting, fever or chills.  Patient noticed several white patches in the back of her throat as a visit to the urgent care.  No sick contacts.  No rash noted.  No cough.  No shortness of breath or wheezing. HPI  Past Medical History:  Diagnosis Date   Alcohol abuse    Cut down on her own. drunk on regular basis in past in college. Prescription drug abuse- oxycodone and xanax snorting stopped a year or two after college.    Former smoker    quit 2016. 1/4 PPD 7 years.    Migraines    2016 was getting them frequently- doing much better now. no medicine    Patient Active Problem List   Diagnosis Date Noted   Anxiety 02/21/2017   Former smoker    Migraines    Alcohol abuse    Oral contraceptive use 03/21/2015    Past Surgical History:  Procedure Laterality Date   WISDOM TOOTH EXTRACTION      OB History   No obstetric history on file.      Home Medications    Prior to Admission medications   Medication Sig Start Date End Date Taking? Authorizing Provider  busPIRone (BUSPAR) 5 MG tablet Take 1 tablet (5 mg total) by mouth 2 (two) times daily. 10/25/22   Shelva Majestic, MD  levonorgestrel-ethinyl estradiol Janann Colonel) 0.1-20 MG-MCG tablet Take 1 tablet by mouth daily. 11/16/21   Shelva Majestic, MD  spironolactone (ALDACTONE) 50 MG tablet TAKE 3 TABLET BY MOUTH EVERY DAY FOR 30 DAYS Orally Once a day for 30 days    [provider]    Family History Family History  Problem Relation Age of Onset   Depression Mother        also obese   Drug abuse Mother         prescription drug abuse   Arrhythmia Father    Hyperlipidemia Father    Peripheral vascular disease Father        carotid artery   Alcohol abuse Father    Hypertension Father    Healthy Sister        half sister   Alcohol abuse Brother    Heart attack Maternal Grandmother    Other Maternal Grandfather        accidental death   Other Paternal Grandmother        "enlarged heart" never saw doctor   Colon cancer Paternal Grandmother    Heart attack Paternal Grandfather        under 96    Social History Social History   Tobacco Use   Smoking status: Former    Packs/day: 0.25    Years: 6.00    Additional pack years: 0.00    Total pack years: 1.50    Types: Cigarettes    Quit date: 05/30/2015    Years since quitting: 7.4   Smokeless tobacco: Never  Vaping Use   Vaping Use: Never used  Substance Use Topics   Alcohol use: Not  Currently    Comment: weekly   Drug use: Yes    Types: Marijuana     Allergies   Patient has no known allergies.   Review of Systems Review of Systems As per HPI  Physical Exam Triage Vital Signs ED Triage Vitals  Enc Vitals Group     BP 10/26/22 1723 127/81     Pulse Rate 10/26/22 1723 65     Resp 10/26/22 1723 15     Temp 10/26/22 1723 98.7 F (37.1 C)     Temp Source 10/26/22 1723 Oral     SpO2 10/26/22 1723 97 %     Weight --      Height --      Head Circumference --      Peak Flow --      Pain Score 10/26/22 1722 4     Pain Loc --      Pain Edu? --      Excl. in GC? --    No data found.  Updated Vital Signs BP 127/81 (BP Location: Right Arm)   Pulse 65   Temp 98.7 F (37.1 C) (Oral)   Resp 15   LMP 10/16/2022   SpO2 97%   Visual Acuity Right Eye Distance:   Left Eye Distance:   Bilateral Distance:    Right Eye Near:   Left Eye Near:    Bilateral Near:     Physical Exam Vitals and nursing note reviewed.  Constitutional:      General: She is not in acute distress.    Appearance: She is not ill-appearing.   HENT:     Right Ear: Tympanic membrane normal. No tenderness.     Left Ear: Tympanic membrane normal. No tenderness.     Nose: No congestion.     Mouth/Throat:     Pharynx: Posterior oropharyngeal erythema present.     Tonsils: Tonsillar exudate present. 2+ on the right. 2+ on the left.  Eyes:     Conjunctiva/sclera: Conjunctivae normal.  Cardiovascular:     Rate and Rhythm: Normal rate and regular rhythm.  Neurological:     Mental Status: She is alert.      UC Treatments / Results  Labs (all labs ordered are listed, but only abnormal results are displayed) Labs Reviewed  CULTURE, GROUP A STREP [facility])  POCT RAPID STREP A (OFFICE)  POCT MONO SCREEN [facility])    EKG   Radiology No results found.  Procedures Procedures (including critical care time)  Medications Ordered in UC Medications - No data to display  Initial Impression / Assessment and Plan / UC Course  I have reviewed the triage vital signs and the nursing notes.  Pertinent labs & imaging results that were available during my care of the patient were reviewed by me and considered in my medical decision making (see chart for details).     1.  Acute viral pharyngitis: Point-of-care strep is negative Throat cultures has been sent Monospot test is negative Warm salt water gargle Chloraseptic throat spray Will call patient with recommendations if labs are abnormal Return precautions given. Final Clinical Impressions(s) / UC Diagnoses   Final diagnoses:  Acute viral pharyngitis     Discharge Instructions      Warm salt water gargle Chloraseptic throat spray as needed. Ibuprofen or Tylenol as needed for pain and/or fever Maintain adequate hydration Strep test is negative Monotest is negative Will call you with recommendations if throat culture is abnormal Return to urgent care if  you have worsening symptoms.     ED Prescriptions   None    PDMP not reviewed this encounter.   Merrilee Jansky, MD 10/26/22 [phone]

## 2022-10-26 NOTE — ED Triage Notes (Signed)
Pt had sore throat for past coupe days. Noticed swelling and white-yellow patches on back of throat. Taking Nyquil at night.

## 2022-10-26 NOTE — Discharge Instructions (Addendum)
Warm salt water gargle Chloraseptic throat spray as needed. Ibuprofen or Tylenol as needed for pain and/or fever Maintain adequate hydration Strep test is negative Monotest is negative Will call you with recommendations if throat culture is abnormal Return to urgent care if you have worsening symptoms.

## 2022-10-29 LAB — CULTURE, GROUP A STREP (THRC)

## 2022-11-15 ENCOUNTER — Ambulatory Visit
Admission: EM | Admit: 2022-11-15 | Discharge: 2022-11-15 | Disposition: A | Discharge status: Home / Self Care | Payer: Managed Care, Other (non HMO) | Attending: Nurse Practitioner | Admitting: Nurse Practitioner

## 2022-11-15 DIAGNOSIS — H66001 Acute suppurative otitis media without spontaneous rupture of ear drum, right ear: Secondary | ICD-10-CM | POA: Diagnosis not present

## 2022-11-15 DIAGNOSIS — R0981 Nasal congestion: Secondary | ICD-10-CM

## 2022-11-15 DIAGNOSIS — Z87891 Personal history of nicotine dependence: Secondary | ICD-10-CM | POA: Diagnosis not present

## 2022-11-15 DIAGNOSIS — J0100 Acute maxillary sinusitis, unspecified: Secondary | ICD-10-CM | POA: Diagnosis not present

## 2022-11-15 DIAGNOSIS — Z1152 Encounter for screening for COVID-19: Secondary | ICD-10-CM | POA: Diagnosis not present

## 2022-11-15 MED ORDER — FLUTICASONE PROPIONATE 50 MCG/ACT NA SUSP: 1.0000 | Freq: Every day | NASAL | 0 refills | Status: DC

## 2022-11-15 MED ORDER — AMOXICILLIN-POT CLAVULANATE 875-125 MG PO TABS: 1.0000 | ORAL_TABLET | Freq: Two times a day (BID) | ORAL | 0 refills | Status: AC

## 2022-11-15 MED ORDER — PREDNISONE 20 MG PO TABS: 40.0000 mg | ORAL_TABLET | Freq: Every day | ORAL | 0 refills | Status: AC

## 2022-11-15 NOTE — ED Triage Notes (Signed)
Pt presents with c/o possible sinus infection. States she was on a plane, made her sinus pressure worse.   Pt states she felt nauseas yesterday.

## 2022-11-15 NOTE — Discharge Instructions (Signed)
The clinic will contact you with results of your COVID test is positive Start Augmentin twice daily for 10 days Flonase daily Prednisone daily for 5 days Nasal rinses as tolerated Rest and fluids Please follow-up with your PCP if your symptoms do not improve Please go to emergency room if you develop any worsening symptoms

## 2022-11-15 NOTE — ED Provider Notes (Addendum)
[facility]    CSN: [id number] Arrival date & time: 11/15/22  1040      History   Chief Complaint Chief Complaint  Patient presents with   Facial Pain    HPI Heidi Long is a 32 y.o. female  presents for evaluation of URI symptoms for 2 days. Patient reports associated symptoms of sinus pressure/congestion, ear pain, nausea, hearing changes, body aches. Denies V/D, fevers, cough, or SOB. Patient does not have a hx of asthma. Is a previous smoker. No known sick contacts.  Pt has taken DQ/NQ OTC for symptoms. Pt has no other concerns at this time.   HPI  Past Medical History:  Diagnosis Date   Alcohol abuse    Cut down on her own. drunk on regular basis in past in college. Prescription drug abuse- oxycodone and xanax snorting stopped a year or two after college.    Former smoker    quit 2016. 1/4 PPD 7 years.    Migraines    2016 was getting them frequently- doing much better now. no medicine    Patient Active Problem List   Diagnosis Date Noted   Anxiety 02/21/2017   Former smoker    Migraines    Alcohol abuse    Oral contraceptive use 03/21/2015    Past Surgical History:  Procedure Laterality Date   WISDOM TOOTH EXTRACTION      OB History   No obstetric history on file.      Home Medications    Prior to Admission medications   Medication Sig Start Date End Date Taking? Authorizing Provider  amoxicillin-clavulanate (AUGMENTIN) 875-125 MG tablet Take 1 tablet by mouth every 12 (twelve) hours for 10 days. 11/15/22 11/25/22 Yes Radford Pax, NP  fluticasone (FLONASE) 50 MCG/ACT nasal spray Place 1 spray into both nostrils daily. 11/15/22  Yes Radford Pax, NP  predniSONE (DELTASONE) 20 MG tablet Take 2 tablets (40 mg total) by mouth daily with breakfast for 5 days. 11/15/22 11/20/22 Yes Radford Pax, NP  busPIRone (BUSPAR) 5 MG tablet Take 1 tablet (5 mg total) by mouth 2 (two) times daily. 10/25/22   Shelva Majestic, MD  levonorgestrel-ethinyl  estradiol Janann Colonel) 0.1-20 MG-MCG tablet Take 1 tablet by mouth daily. 11/16/21   Shelva Majestic, MD  spironolactone (ALDACTONE) 50 MG tablet TAKE 3 TABLET BY MOUTH EVERY DAY FOR 30 DAYS Orally Once a day for 30 days    [provider]    Family History Family History  Problem Relation Age of Onset   Depression Mother        also obese   Drug abuse Mother        prescription drug abuse   Arrhythmia Father    Hyperlipidemia Father    Peripheral vascular disease Father        carotid artery   Alcohol abuse Father    Hypertension Father    Healthy Sister        half sister   Alcohol abuse Brother    Heart attack Maternal Grandmother    Other Maternal Grandfather        accidental death   Other Paternal Grandmother        "enlarged heart" never saw doctor   Colon cancer Paternal Grandmother    Heart attack Paternal Grandfather        under 55    Social History Social History   Tobacco Use   Smoking status: Former    Packs/day: 0.25  Years: 6.00    Additional pack years: 0.00    Total pack years: 1.50    Types: Cigarettes    Quit date: 05/30/2015    Years since quitting: 7.4   Smokeless tobacco: Never  Vaping Use   Vaping Use: Never used  Substance Use Topics   Alcohol use: Not Currently    Comment: weekly   Drug use: Yes    Types: Marijuana     Allergies   Patient has no known allergies.   Review of Systems Review of Systems  HENT:  Positive for congestion, ear pain, sinus pressure and sinus pain.   Musculoskeletal:  Positive for myalgias.     Physical Exam Triage Vital Signs ED Triage Vitals  Enc Vitals Group     BP 11/15/22 1049 121/89     Pulse Rate 11/15/22 1047 (!) 114     Resp 11/15/22 1047 15     Temp 11/15/22 1047 98.2 F (36.8 C)     Temp Source 11/15/22 1047 Oral     SpO2 11/15/22 1047 98 %     Weight --      Height --      Head Circumference --      Peak Flow --      Pain Score 11/15/22 1047 5     Pain Loc --       Pain Edu? --      Excl. in GC? --    No data found.  Updated Vital Signs BP 121/89   Pulse (!) 114   Temp 98.2 F (36.8 C) (Oral)   Resp 15   LMP 10/16/2022 (Exact Date)   SpO2 98%   Visual Acuity Right Eye Distance:   Left Eye Distance:   Bilateral Distance:    Right Eye Near:   Left Eye Near:    Bilateral Near:     Physical Exam Vitals and nursing note reviewed.  Constitutional:      General: She is not in acute distress.    Appearance: She is well-developed. She is not ill-appearing.  HENT:     Head: Normocephalic and atraumatic.     Right Ear: Ear canal normal. Tympanic membrane is erythematous.     Left Ear: Tympanic membrane and ear canal normal.     Nose: Mucosal edema and congestion present.     Right Sinus: Maxillary sinus tenderness present.     Left Sinus: Maxillary sinus tenderness present.     Mouth/Throat:     Mouth: Mucous membranes are moist.     Pharynx: Oropharynx is clear. Uvula midline. No posterior oropharyngeal erythema.     Tonsils: No tonsillar exudate or tonsillar abscesses.  Eyes:     Conjunctiva/sclera: Conjunctivae normal.     Pupils: Pupils are equal, round, and reactive to light.  Cardiovascular:     Rate and Rhythm: Normal rate and regular rhythm.     Heart sounds: Normal heart sounds.  Pulmonary:     Effort: Pulmonary effort is normal.     Breath sounds: Normal breath sounds.  Musculoskeletal:     Cervical back: Normal range of motion and neck supple.  Lymphadenopathy:     Cervical: No cervical adenopathy.  Skin:    General: Skin is warm and dry.  Neurological:     General: No focal deficit present.     Mental Status: She is alert and oriented to person, place, and time.  Psychiatric:        Mood and Affect: Mood  normal.        Behavior: Behavior normal.      UC Treatments / Results  Labs (all labs ordered are listed, but only abnormal results are displayed) Labs Reviewed  SARS CORONAVIRUS 2 (TAT 6-24 HRS)    Comprehensive metabolic panel Order: [id number] Status: Final result     Visible to patient: Yes (seen)     Next appt: 11/19/2022 at 08:00 AM in Family Medicine Tana Conch, MD)     Dx: Anxiety   2 Result Notes     1 Patient Communication        Component Ref Range & Units 3 wk ago (10/25/22) 2 yr ago (05/10/20) 5 yr ago (02/20/17) 6 yr ago (06/14/16) 6 yr ago (05/09/16)  Sodium 135 - 145 mEq/L 138 140 R 140 140 140 R  Potassium 3.5 - 5.1 mEq/L 4.2 4.7 R 4.4 4.2 4.3 R  Chloride 96 - 112 mEq/L 103 103 R 105 103 104 R  CO2 19 - 32 mEq/L 29 23 R 30 30 26  R  Glucose, Bld 70 - 99 mg/dL 77 90 R, CM 91 95 88 R  BUN 6 - 23 mg/dL 9 9 R 8 7 8  R  Creatinine, Ser 0.40 - 1.20 mg/dL 4.09 8.11 R 9.14 7.82 9.56 R  Total Bilirubin 0.2 - 1.2 mg/dL 0.4 0.9 0.8 0.7 0.5  Alkaline Phosphatase 39 - 117 U/L 74  90 107 83 R  AST 0 - 37 U/L 20 17 R 15 15 17  R  ALT 0 - 35 U/L 25 10 R 11 10 11  R  Total Protein 6.0 - 8.3 g/dL 6.8 6.9 R 6.8 7.3 7.0 R  Albumin 3.5 - 5.2 g/dL 4.3  4.8 4.6 4.5 R  GFR >60.00 mL/min 98.04  111.21 113.75   Comment: Calculated using the CKD-EPI Creatinine Equation (2021)  Calcium 8.4 - 10.5 mg/dL 9.4 9.7 R 9.9 9.5 9.7 R  Resulting Agency  [facility]         Specimen Collected: 10/25/22 15:03 Last Resulted: 10/26/22 11:19        EKG   Radiology No results found.  Procedures Procedures (including critical care time)  Medications Ordered in UC Medications - No data to display  Initial Impression / Assessment and Plan / UC Course  I have reviewed the triage vital signs and the nursing notes.  Pertinent labs & imaging results that were available during my care of the patient were reviewed by me and considered in my medical decision making (see chart for details).    Reviewed recent labs  Reviewed exam and sx with patient. NO red flags  Start Augmentin for right OM Flonase daily Prednisone  daily for 5 days Nasal rinses as tolerated Rest and fluids PCP follow-up if symptoms do not improve ER precautions reviewed and patient verbalized understanding Final Clinical Impressions(s) / UC Diagnoses   Final diagnoses:  Sinus congestion  Acute maxillary sinusitis, recurrence not specified  Acute suppurative otitis media of right ear without spontaneous rupture of tympanic membrane, recurrence not specified     Discharge Instructions      The clinic will contact you with results of your COVID test is positive Start Augmentin twice daily for 10 days Flonase daily Prednisone daily for 5 days Nasal rinses as tolerated Rest and fluids Please follow-up with your PCP if your symptoms do not improve Please go to emergency room if you develop any worsening symptoms   ED Prescriptions  Medication Sig Dispense Auth. Provider   fluticasone (FLONASE) 50 MCG/ACT nasal spray Place 1 spray into both nostrils daily. 15.8 mL Radford Pax, NP   amoxicillin-clavulanate (AUGMENTIN) 875-125 MG tablet Take 1 tablet by mouth every 12 (twelve) hours for 10 days. 20 tablet Radford Pax, NP   predniSONE (DELTASONE) 20 MG tablet Take 2 tablets (40 mg total) by mouth daily with breakfast for 5 days. 10 tablet Radford Pax, NP      PDMP not reviewed this encounter.   Radford Pax, NP 11/15/22 1105    Radford Pax, NP 11/15/22 1106

## 2022-11-16 LAB — SARS CORONAVIRUS 2 (TAT 6-24 HRS): SARS Coronavirus 2: NEGATIVE

## 2022-11-19 ENCOUNTER — Ambulatory Visit (INDEPENDENT_AMBULATORY_CARE_PROVIDER_SITE_OTHER): Payer: Managed Care, Other (non HMO) | Admitting: Family Medicine

## 2022-11-19 ENCOUNTER — Encounter: Payer: Self-pay | Admitting: Family Medicine

## 2022-11-19 VITALS — BP 120/76 | HR 63 | Temp 97.9°F | Ht 66.0 in | Wt 144.2 lb

## 2022-11-19 DIAGNOSIS — Z0000 Encounter for general adult medical examination without abnormal findings: Secondary | ICD-10-CM | POA: Diagnosis not present

## 2022-11-19 NOTE — Progress Notes (Signed)
Phone [phone]   Subjective:  Patient presents today for their annual physical. Chief complaint-noted.   See problem oriented charting- ROS- full  review of systems was completed and negative except for: sinus pressure, ear pain from recent infection, anxiety, sad mood  The following were reviewed and entered/updated in epic: Past Medical History:  Diagnosis Date   Alcohol abuse    Cut down on her own. drunk on regular basis in past in college. Prescription drug abuse- oxycodone and xanax snorting stopped a year or two after college.    Former smoker    quit 2016. 1/4 PPD 7 years.    Migraines    2016 was getting them frequently- doing much better now. no medicine   Patient Active Problem List   Diagnosis Date Noted   Anxiety 02/21/2017    Priority: High   Alcohol abuse     Priority: High   Migraines     Priority: Medium    Oral contraceptive use 03/21/2015    Priority: Medium    Former smoker     Priority: Low   Past Surgical History:  Procedure Laterality Date   WISDOM TOOTH EXTRACTION      Family History  Problem Relation Age of Onset   Depression Mother        also obese   Drug abuse Mother        prescription drug abuse   Arrhythmia Father    Hyperlipidemia Father    Peripheral vascular disease Father        carotid artery   Alcohol abuse Father    Hypertension Father    Healthy Sister        half sister   Alcohol abuse Brother    Heart attack Maternal Grandmother    Other Maternal Grandfather        accidental death   Other Paternal Grandmother        "enlarged heart" never saw doctor   Colon cancer Paternal Grandmother    Heart attack Paternal Grandfather        under 50    Medications- reviewed and updated Current Outpatient Medications  Medication Sig Dispense Refill   amoxicillin-clavulanate (AUGMENTIN) 875-125 MG tablet Take 1 tablet by mouth every 12 (twelve) hours for 10 days. 20 tablet 0   busPIRone (BUSPAR) 5 MG tablet Take 1  tablet (5 mg total) by mouth 2 (two) times daily. 60 tablet 5   fluticasone (FLONASE) 50 MCG/ACT nasal spray Place 1 spray into both nostrils daily. 15.8 mL 0   levonorgestrel-ethinyl estradiol (AVIANE) 0.1-20 MG-MCG tablet Take 1 tablet by mouth daily. 28 tablet 12   predniSONE (DELTASONE) 20 MG tablet Take 2 tablets (40 mg total) by mouth daily with breakfast for 5 days. 10 tablet 0   spironolactone (ALDACTONE) 50 MG tablet TAKE 3 TABLET BY MOUTH EVERY DAY FOR 30 DAYS Orally Once a day for 30 days     No current facility-administered medications for this visit.    Allergies-reviewed and updated No Known Allergies  Social History   Social History Narrative   Single. Lives alone. 1 cat.       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    MBA at [employer] finished dec 2022   Went to [employer]- english major-      Hobbies: youtube and music, enjoys Air traffic controller.    Objective  Objective:  BP 120/76   Pulse 63   Temp 97.9 F (36.6 C)   Ht 5\' 6"  (1.676  m)   Wt 144 lb 3.2 oz (65.4 kg)   LMP 10/16/2022 (Exact Date)   BMI 23.27 kg/m  Gen: NAD, resting comfortably HEENT: Mucous membranes are moist. Oropharynx normal- throat appears to be improving. RIght ear with infection previously diagnosed- mild fluid but no erythema Neck: no thyromegaly CV: RRR no murmurs rubs or gallops Lungs: CTAB no crackles, wheeze, rhonchi Abdomen: soft/nontender/nondistended/normal bowel sounds. No rebound or guarding.  Ext: no edema Skin: warm, dry Neuro: grossly normal, moves all extremities, PERRLA   Assessment and Plan   32 y.o. female presenting for annual physical.  Health Maintenance counseling: 1. Anticipatory guidance: Patient counseled regarding regular dental exams -q6 months, eye exams - has seen in last year- wears contacts,  avoiding smoking and second hand smoke , limiting alcohol to 1 beverage per day- not drinking other than once had 2 drinks before getting on a plane , no illicit drugs -  other than occasional marijuana - advised against for lung health.   2. Risk factor reduction:  Advised patient of need for regular exercise and diet rich and fruits and vegetables to reduce risk of heart attack and stroke.  Exercise-running or walking for up to 45 minutes at times but sporadic.  Occasionally gets on bike trails.  Does not use [employer] that she owns. Recently has not been exercising- encouraged again especially for mental health. Boyfriend has encouraged crossfit Diet/weight management-weight stable from last year, healthy BMI-feels good improve diet low.  Wt Readings from Last 3 Encounters:  11/19/22 144 lb 3.2 oz (65.4 kg)  10/25/22 143 lb (64.9 kg)  11/16/21 144 lb 12.8 oz (65.7 kg)  3. Immunizations/screenings/ancillary studies-declines further COVID-19 vaccination Immunization History  Administered Date(s) Administered   Influenza,inj,Quad PF,6+ Mos 05/09/2016, 05/10/2020   PFIZER(Purple Top)SARS-COV-2 Vaccination 10/01/2019, 10/22/2019   Tdap 03/11/2010, 05/10/2020  4. Cervical cancer screening- reassuring 05/10/2020-she prefers to wait till next year's physical  5. Breast cancer screening-  breast exam-encouraged self exams- and mammogram -discussed starting 40 with no family history 6. Colon cancer screening - no first-degree relatives with colon cancer  (grandma) history or polyp history reported.  Will plan to start age 80.  In the past did have some hemorrhoidal bleeding and follow-up fecal occult blood test was negative- still randomly occurs with constipation. 7. Skin cancer screening- sees derm specialists and they manage acne with spironolactone. advised regular sunscreen use. Denies worrisome, changing, or new skin lesions.  8. Birth control/STD check- monogamous with boyfriend-declines STD screening.  She remains on oral contraceptive pill that we prescribe 9. Osteoporosis screening at 86- consider later in life 10. Smoking associated screening - former   smoker-quit 2016- only 1.5 pack years- she opted to hold off on urine for now  Status of chronic or acute concerns   # Anxiety-see full note 10/25/2022.  No recent drug or alcohol abuse.  PHQ-9 of 11 and GAD-7 at 21 at that time.  At last visit she wanted to try buspirone as well as therapy particular to help with parents prior divorce and lack of processing/lack of healthy boundaries with her mom.  She is scheduled with Dr. Wednesday on June 11 for therapy. -scores today are a little higher with PHQ9 of 15, generalized anxiety disorder& of 21 BUT she has recently been ill and had to do a lot of work travel- both additional stressors - she does feel like buspirone is helpful/takes edge off- taking every night and occasionally twice a day - she is considering taking  twice daily  but still getting daytime fatigue with it  # recent sinusitis/otitis media.  She was started on Augmentin, Flonase, prednisone on 11/15/2022-she reports gradually improving. No side effects from meds  - did end up after our visit getting diagnosed with strep but never took anything for that as throat improved until recent Augmentin treatment for 10 days  #hyperlipidemia S: Medication: None Lab Results  Component Value Date   CHOL 212 (H) 10/25/2022   HDL 70.90 10/25/2022   LDLCALC 107 (H) 10/25/2022   TRIG 170.0 (H) 10/25/2022   CHOLHDL 3 10/25/2022   A/P: Very mildly high lipids and increased from 6 years ago (but had cookout before eating) but certainly not in range to start medicine.  CBC, CMP, TSH normal at day -had screening at work that was encouraging   Recommended follow up: Return in about 1 year (around 11/19/2023) for physical or sooner if needed.Schedule b4 you leave. Future Appointments  Date Time Provider Department Center  12/18/2022  1:00 PM Winstead, Lawana Chambers, LMFT [facility] None   Lab/Order associations: fasting   ICD-10-CM   1. Preventative health care  Z00.00       No orders of the defined  types were placed in this encounter.   Return precautions advised.  Tana Conch, MD

## 2022-11-19 NOTE — Patient Instructions (Addendum)
Continue buspirone- can do twice a day if needed and you tolerate it- another option would be doing 2.5 in the morning and then the 5 mg at bed.   Recommended follow up: Return in about 1 year (around 11/19/2023) for physical or sooner if needed.Schedule b4 you leave. - if needed sooner in discussion with Dr. Monna Fam please feel free to schedule to see [place] sooner

## 2022-12-11 ENCOUNTER — Other Ambulatory Visit: Payer: Self-pay

## 2022-12-11 MED ORDER — LEVONORGESTREL-ETHINYL ESTRAD 0.1-20 MG-MCG PO TABS: 1.0000 | ORAL_TABLET | Freq: Every day | ORAL | 12 refills | Status: DC

## 2022-12-18 ENCOUNTER — Ambulatory Visit (INDEPENDENT_AMBULATORY_CARE_PROVIDER_SITE_OTHER): Payer: 59 | Admitting: Behavioral Health

## 2022-12-18 DIAGNOSIS — F419 Anxiety disorder, unspecified: Secondary | ICD-10-CM

## 2022-12-18 NOTE — Progress Notes (Signed)
                Stefan Markarian L Jenisis Harmsen, LMFT 
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
## 2023-01-08 ENCOUNTER — Ambulatory Visit: Payer: 59 | Admitting: Behavioral Health

## 2023-04-23 ENCOUNTER — Other Ambulatory Visit: Payer: Self-pay | Admitting: Family Medicine

## 2023-06-19 ENCOUNTER — Ambulatory Visit (INDEPENDENT_AMBULATORY_CARE_PROVIDER_SITE_OTHER): Payer: Managed Care, Other (non HMO) | Admitting: Family Medicine

## 2023-06-19 ENCOUNTER — Encounter: Payer: Self-pay | Admitting: Family Medicine

## 2023-06-19 VITALS — BP 118/76 | HR 88 | Temp 97.9°F | Ht 66.0 in | Wt 145.0 lb

## 2023-06-19 DIAGNOSIS — G43009 Migraine without aura, not intractable, without status migrainosus: Secondary | ICD-10-CM

## 2023-06-19 MED ORDER — SUMATRIPTAN SUCCINATE 50 MG PO TABS: 50.0000 mg | ORAL_TABLET | ORAL | 0 refills | Status: DC | PRN

## 2023-06-19 NOTE — Patient Instructions (Addendum)
Health Maintenance Due  Topic Date Due   Cervical Cancer Screening (HPV/Pap Cotest)  05/11/2023  - please update your pap smear and have them send [place] a copy  Migraines with worsening pattern- we opted to trial sumatriptan 50 mg- can take one at very beginning of migraine and repeat in 2 hours (max 2 per day)  -encouraged to limit caffeine- could be getting rebound.  Riboflavin (Vitamin B2) is recommended at a dosage of 400 mg orally once daily for adults   Magnesium, particularly in the forms of magnesium glycinate or citrate, is suggested at dosages ranging from 360 to 600 mg daily   Recommended follow up: Return for next already scheduled visit or sooner if needed.

## 2023-06-19 NOTE — Progress Notes (Signed)
Phone [phone] In person visit   Subjective:   Heidi Long is a 32 y.o. year old very pleasant female patient who presents for/with See problem oriented charting Chief Complaint  Patient presents with   Migraine    Past Medical History-  Patient Active Problem List   Diagnosis Date Noted   Anxiety 02/21/2017    Priority: High   Alcohol abuse     Priority: High   Migraines     Priority: Medium    Oral contraceptive use 03/21/2015    Priority: Medium    Former smoker     Priority: Low    Medications- reviewed and updated Current Outpatient Medications  Medication Sig Dispense Refill   busPIRone (BUSPAR) 5 MG tablet TAKE 1 TABLET(5 MG) BY MOUTH TWICE DAILY 60 tablet 5   fluticasone (FLONASE) 50 MCG/ACT nasal spray Place 1 spray into both nostrils daily. 15.8 mL 0   levonorgestrel-ethinyl estradiol (AVIANE) 0.1-20 MG-MCG tablet Take 1 tablet by mouth daily. 28 tablet 12   spironolactone (ALDACTONE) 50 MG tablet  Through dermatology TAKE 3 TABLET BY MOUTH EVERY DAY FOR 30 DAYS Orally Once a day for 30 days     tretinoin (RETIN-A) 0.025 % cream Apply 1 Application topically at bedtime.     No current facility-administered medications for this visit.     Objective:  BP 118/76   Pulse 88   Temp 97.9 F (36.6 C) (Temporal)   Ht 5\' 6"  (1.676 m)   Wt 145 lb (65.8 kg)   LMP 06/10/2023   SpO2 98%   BMI 23.40 kg/m  Gen: NAD, resting comfortably CV: RRR no murmurs rubs or gallops Lungs: CTAB no crackles, wheeze, rhonchi Ext: no edema Skin: warm, dry Neuro: CN II-XII intact, sensation and reflexes normal throughout, 5/5 muscle strength in bilateral upper and lower extremities. Normal finger to nose. Normal rapid alternating movements. No pronator drift. Normal romberg. Normal gait.      Assessment and Plan   # Migraines S:patient has noted more frequent migraines. She tried some medications from a family member- one of them helped but she doesn't think on  market anymore- almotriptan- another one maxalt did not help. Previously was only 2-3 x a month- currently up to 3-4 within 2 week period- in last few days has been better. Began to have one on Monday but rested and was able to work through it- took Excedrin migraine- lingered but never peaked and eventually resolved. Intensity may have bene slightly higher than usual but not sure.  -already does magnesium  Thinks stress induced possibly. Only occur on off days when not at work.  Has not increased alcohol use- in fact has even avoided to try to help with migraines. Caffeine she has tried to reduce. Prior was drinking monster drinks- has cut those down and has done tea instead or soda. - no aura - No facial or extremity weakness. No slurred words or trouble swallowing. no blurry vision or double vision. No paresthesias. No confusion or word finding difficulties.   A/P: Migraines with worsening pattern- we opted to trial sumatriptan 50 mg- can take one at very beginning of migraine and repeat in 2 hours (max 2 per day)  -could take ibuprofen along with it if needed -encouraged to limit caffeine- could be getting rebound. -also limit sumatriptan or other abortive treatments to twice a week - discussed b2 and magnesium as well- already on magnesium but gave dose   Recommended follow up: Return for next  already scheduled visit or sooner if needed. Future Appointments  Date Time Provider Department Center  11/26/2023 11:00 AM Shelva Majestic, MD [facility]    Lab/Order associations: No diagnosis found.  Meds ordered this encounter  Medications   SUMAtriptan (IMITREX) 50 MG tablet    Sig: Take 1 tablet (50 mg total) by mouth every 2 (two) hours as needed for migraine (max 2 a day.). May repeat in 2 hours if headache persists or recurs.    Dispense:  10 tablet    Refill:  0    Return precautions advised.  Tana Conch, MD

## 2023-09-27 ENCOUNTER — Ambulatory Visit
Admission: RE | Admit: 2023-09-27 | Discharge: 2023-09-27 | Disposition: A | Discharge status: Home / Self Care | Source: Ambulatory Visit | Attending: Family Medicine | Admitting: Family Medicine

## 2023-09-27 VITALS — BP 126/89 | HR 77 | Temp 99.2°F | Resp 16

## 2023-09-27 DIAGNOSIS — J029 Acute pharyngitis, unspecified: Secondary | ICD-10-CM | POA: Insufficient documentation

## 2023-09-27 DIAGNOSIS — B349 Viral infection, unspecified: Secondary | ICD-10-CM | POA: Insufficient documentation

## 2023-09-27 LAB — POCT RAPID STREP A (OFFICE): Rapid Strep A Screen: NEGATIVE

## 2023-09-27 NOTE — ED Triage Notes (Signed)
 Pt present with a sore throat x 2 days and white spots on her tonsils x 1 day. States she also had body aches and woke up feeling worse.   Home interventions:Tylenol, Nyquil.

## 2023-09-27 NOTE — ED Provider Notes (Signed)
 [facility]    CSN: [id number] Arrival date & time: 09/27/23  1137      History   Chief Complaint Chief Complaint  Patient presents with   Sore Throat    Sore throat accompanied by body chills and aches. Those have gotten better today (so far) but throat still sore and caught a glimpse of a couple white spots. Want to get it checked out. - Entered by patient    HPI Verneal Long is a 33 y.o. female  presents for evaluation of URI symptoms for 3 days. Patient reports associated symptoms of sore throat with body aches, ear pain. Denies N/V/D, cough, congestion, fevers, shortness of breath.  Patient does not have a hx of asthma. Patient is not an active smoker.   Reports sick contacts via work.  Pt has taken NyQuil OTC for symptoms. Pt has no other concerns at this time.    Sore Throat    Past Medical History:  Diagnosis Date   Alcohol abuse    Cut down on her own. drunk on regular basis in past in college. Prescription drug abuse- oxycodone and xanax snorting stopped a year or two after college.    Former smoker    quit 2016. 1/4 PPD 7 years.    Migraines    2016 was getting them frequently- doing much better now. no medicine    Patient Active Problem List   Diagnosis Date Noted   Anxiety 02/21/2017   Former smoker    Migraines    Alcohol abuse    Oral contraceptive use 03/21/2015    Past Surgical History:  Procedure Laterality Date   WISDOM TOOTH EXTRACTION      OB History   No obstetric history on file.      Home Medications    Prior to Admission medications   Medication Sig Start Date End Date Taking? Authorizing Provider  busPIRone (BUSPAR) 5 MG tablet TAKE 1 TABLET(5 MG) BY MOUTH TWICE DAILY 04/23/23   Shelva Majestic, MD  fluticasone [facility]) 50 MCG/ACT nasal spray Place 1 spray into both nostrils daily. 11/15/22   Radford Pax, NP  levonorgestrel-ethinyl estradiol (AVIANE) 0.1-20 MG-MCG tablet Take 1 tablet by mouth daily. 12/11/22    Shelva Majestic, MD  spironolactone (ALDACTONE) 50 MG tablet TAKE 3 TABLET BY MOUTH EVERY DAY FOR 30 DAYS Orally Once a day for 30 days    [provider]  SUMAtriptan (IMITREX) 50 MG tablet Take 1 tablet (50 mg total) by mouth every 2 (two) hours as needed for migraine (max 2 a day.). May repeat in 2 hours if headache persists or recurs. 06/19/23   Shelva Majestic, MD  tretinoin (RETIN-A) 0.025 % cream Apply 1 Application topically at bedtime. 04/11/23   [provider]    Family History Family History  Problem Relation Age of Onset   Depression Mother        also obese   Drug abuse Mother        prescription drug abuse   Arrhythmia Father    Hyperlipidemia Father    Peripheral vascular disease Father        carotid artery   Alcohol abuse Father    Hypertension Father    Healthy Sister        half sister   Alcohol abuse Brother    Heart attack Maternal Grandmother    Other Maternal Grandfather        accidental death   Other Paternal  Grandmother        "enlarged heart" never saw doctor   Colon cancer Paternal Grandmother    Heart attack Paternal Grandfather        under 78    Social History Social History   Tobacco Use   Smoking status: Former    Current packs/day: 0.00    Average packs/day: 0.3 packs/day for 6.0 years (1.5 ttl pk-yrs)    Types: Cigarettes    Start date: 05/29/2009    Quit date: 05/30/2015    Years since quitting: 8.3   Smokeless tobacco: Never  Vaping Use   Vaping status: Never Used  Substance Use Topics   Alcohol use: Not Currently    Comment: weekly   Drug use: Yes    Types: Marijuana     Allergies   Penicillin g   Review of Systems Review of Systems  HENT:  Positive for ear pain and sore throat.   Musculoskeletal:  Positive for myalgias.     Physical Exam Triage Vital Signs ED Triage Vitals  Encounter Vitals Group     BP 09/27/23 1203 126/89     Systolic BP Percentile --      Diastolic BP Percentile --       Pulse Rate 09/27/23 1203 77     Resp 09/27/23 1203 16     Temp 09/27/23 1203 99.2 F (37.3 C)     Temp Source 09/27/23 1203 Oral     SpO2 09/27/23 1203 97 %     Weight --      Height --      Head Circumference --      Peak Flow --      Pain Score 09/27/23 1202 3     Pain Loc --      Pain Education --      Exclude from Growth Chart --    No data found.  Updated Vital Signs BP 126/89 (BP Location: Right Arm)   Pulse 77   Temp 99.2 F (37.3 C) (Oral)   Resp 16   LMP 08/30/2023 (Exact Date)   SpO2 97%   Visual Acuity Right Eye Distance:   Left Eye Distance:   Bilateral Distance:    Right Eye Near:   Left Eye Near:    Bilateral Near:     Physical Exam Vitals and nursing note reviewed.  Constitutional:      General: She is not in acute distress.    Appearance: She is well-developed. She is not ill-appearing.  HENT:     Head: Normocephalic and atraumatic.     Right Ear: Tympanic membrane and ear canal normal.     Left Ear: Tympanic membrane and ear canal normal.     Nose: No congestion or rhinorrhea.     Mouth/Throat:     Mouth: Mucous membranes are moist.     Pharynx: Oropharynx is clear. Uvula midline. Posterior oropharyngeal erythema present.     Tonsils: No tonsillar exudate or tonsillar abscesses.  Eyes:     Conjunctiva/sclera: Conjunctivae normal.     Pupils: Pupils are equal, round, and reactive to light.  Cardiovascular:     Rate and Rhythm: Normal rate and regular rhythm.     Heart sounds: Normal heart sounds.  Pulmonary:     Effort: Pulmonary effort is normal.     Breath sounds: Normal breath sounds.  Musculoskeletal:     Cervical back: Normal range of motion and neck supple.  Lymphadenopathy:     Cervical: No  cervical adenopathy.  Skin:    General: Skin is warm and dry.  Neurological:     General: No focal deficit present.     Mental Status: She is alert and oriented to person, place, and time.  Psychiatric:        Mood and Affect: Mood  normal.        Behavior: Behavior normal.      UC Treatments / Results  Labs (all labs ordered are listed, but only abnormal results are displayed) Labs Reviewed  CULTURE, GROUP A STREP [facility])  POCT RAPID STREP A (OFFICE)    EKG   Radiology No results found.  Procedures Procedures (including critical care time)  Medications Ordered in UC Medications - No data to display  Initial Impression / Assessment and Plan / UC Course  I have reviewed the triage vital signs and the nursing notes.  Pertinent labs & imaging results that were available during my care of the patient were reviewed by me and considered in my medical decision making (see chart for details).     Reviewed exam and symptoms with patient.  No red flags.  Negative rapid strep, will culture.  Patient declined flu or COVID testing.  Discussed viral illness and symptomatic treatment.  PCP follow-up if symptoms do not improve.  ER precautions reviewed and patient verbalized understanding. Final Clinical Impressions(s) / UC Diagnoses   Final diagnoses:  Sore throat  Viral illness     Discharge Instructions      The clinic will contact you with results of the strep throat culture done today if positive.  Please treat your symptoms with over the counter cough medication, tylenol or ibuprofen, humidifier, and rest. Viral illnesses can last 7-14 days. Please follow up with your PCP if your symptoms are not improving. Please go to the ER for any worsening symptoms. This includes but is not limited to fever you can not control with tylenol or ibuprofen, you are not able to stay hydrated, you have shortness of breath or chest pain.  Thank you for choosing [facility] for your healthcare needs. I hope you feel better soon!      ED Prescriptions   None    PDMP not reviewed this encounter.   Radford Pax, NP 09/27/23 1228

## 2023-09-27 NOTE — Discharge Instructions (Signed)
 The clinic will contact you with results of the strep throat culture done today if positive.  Please treat your symptoms with over the counter cough medication, tylenol or ibuprofen, humidifier, and rest. Viral illnesses can last 7-14 days. Please follow up with your PCP if your symptoms are not improving. Please go to the ER for any worsening symptoms. This includes but is not limited to fever you can not control with tylenol or ibuprofen, you are not able to stay hydrated, you have shortness of breath or chest pain.  Thank you for choosing [facility] for your healthcare needs. I hope you feel better soon!

## 2023-09-29 LAB — CULTURE, GROUP A STREP (THRC)

## 2023-09-30 ENCOUNTER — Telehealth (HOSPITAL_COMMUNITY): Payer: Self-pay

## 2023-09-30 MED ORDER — AZITHROMYCIN 250 MG PO TABS: ORAL_TABLET | ORAL | 0 refills | Status: AC

## 2023-09-30 NOTE — Telephone Encounter (Signed)
 Pt reports continued symptoms. Zpack per protocol. Reviewed with patient, verified pharmacy, prescription sent

## 2023-11-26 ENCOUNTER — Encounter: Payer: Managed Care, Other (non HMO) | Admitting: Family Medicine

## 2023-12-20 ENCOUNTER — Other Ambulatory Visit: Payer: Self-pay | Admitting: Family Medicine

## 2023-12-23 ENCOUNTER — Other Ambulatory Visit: Payer: Self-pay

## 2023-12-23 MED ORDER — LEVONORGESTREL-ETHINYL ESTRAD 0.1-20 MG-MCG PO TABS: 1.0000 | ORAL_TABLET | Freq: Every day | ORAL | 12 refills | Status: DC

## 2024-02-28 ENCOUNTER — Other Ambulatory Visit: Payer: Self-pay | Admitting: Family Medicine

## 2024-03-06 ENCOUNTER — Ambulatory Visit: Payer: Self-pay

## 2024-03-06 NOTE — Telephone Encounter (Signed)
 FYI Only or Action Required?: FYI only for provider.  Patient was last seen in primary care on 06/19/2023 by Katrinka Garnette KIDD, MD.  Called Nurse Triage reporting Dysuria.  Symptoms began several days ago.  Interventions attempted: OTC medications: AZO.  Symptoms are: gradually worsening.  Triage Disposition: See Physician Within 24 Hours  Patient/caregiver understands and will follow disposition?: No appointments, advised to go to urgent care            Copied from CRM #[id number]. Topic: Clinical - Red Word Triage >> Mar 06, 2024  9:20 AM Robinson H wrote: [facility] that prompted transfer to Nurse Triage: Possible UTI, burning, urgency, bumpy rash on arms, has been taking AZO not sure if it's from that.             Reason for Disposition  All other patients with painful urination  (Exception: [1] EITHER frequency or urgency AND [2] has on-call doctor.)  Answer Assessment - Initial Assessment Questions 1. SEVERITY: How bad is the pain?  (e.g., Scale 1-10; mild, moderate, or severe)     Mild 2. FREQUENCY: How many times have you had painful urination today?      Twice  3. PATTERN: Is pain present every time you urinate or just sometimes?      Every or almost every  4. ONSET: When did the painful urination start?      5 days ago  5. FEVER: Do you have a fever? If Yes, ask: What is your temperature, how was it measured, and when did it start?     No 6. PAST UTI: Have you had a urine infection before? If Yes, ask: When was the last time? and What happened that time?      Yes 7. CAUSE: What do you think is causing the painful urination?  (e.g., UTI, scratch, Herpes sore)     Possible UTI  8. OTHER SYMPTOMS: Do you have any other symptoms? (e.g., blood in urine, flank pain, genital sores, urgency, vaginal discharge)     Increased urgency, bumpy rash to arms  Protocols used: Urination Pain - Female-A-AH

## 2024-03-10 NOTE — Telephone Encounter (Signed)
 Noted

## 2024-03-17 ENCOUNTER — Other Ambulatory Visit: Payer: Self-pay | Admitting: Family Medicine

## 2024-04-10 ENCOUNTER — Ambulatory Visit
Admission: EM | Admit: 2024-04-10 | Discharge: 2024-04-10 | Disposition: A | Discharge status: Home / Self Care | Attending: Family Medicine | Admitting: Family Medicine

## 2024-04-10 ENCOUNTER — Other Ambulatory Visit: Payer: Self-pay

## 2024-04-10 DIAGNOSIS — J029 Acute pharyngitis, unspecified: Secondary | ICD-10-CM | POA: Insufficient documentation

## 2024-04-10 DIAGNOSIS — B349 Viral infection, unspecified: Secondary | ICD-10-CM | POA: Diagnosis present

## 2024-04-10 LAB — POCT RAPID STREP A (OFFICE): Rapid Strep A Screen: NEGATIVE

## 2024-04-10 NOTE — ED Provider Notes (Signed)
 [facility]    CSN: [id number] Arrival date & time: 04/10/24  1536      History   Chief Complaint No chief complaint on file.   HPI Heidi Long is a 33 y.o. female  presents for evaluation of URI symptoms for 1 days. Patient reports associated symptoms of sore throat, malaise. Denies N/V/D, cough, congestion, ear pain, body aches, shortness of breath. Patient does not have a hx of asthma. Patient is not an active smoker.   Reports possible sick contacts at work.  Pt has taken Tylenol/ibuprofen  OTC for symptoms. Pt has no other concerns at this time.   HPI  Past Medical History:  Diagnosis Date   Alcohol abuse    Cut down on her own. drunk on regular basis in past in college. Prescription drug abuse- oxycodone and xanax snorting stopped a year or two after college.    Former smoker    quit 2016. 1/4 PPD 7 years.    Migraines    2016 was getting them frequently- doing much better now. no medicine    Patient Active Problem List   Diagnosis Date Noted   Anxiety 02/21/2017   Former smoker    Migraines    Alcohol abuse    Oral contraceptive use 03/21/2015    Past Surgical History:  Procedure Laterality Date   WISDOM TOOTH EXTRACTION      OB History   No obstetric history on file.      Home Medications    Prior to Admission medications   Medication Sig Start Date End Date Taking? Authorizing Provider  busPIRone  (BUSPAR ) 5 MG tablet TAKE 1 TABLET(5 MG) BY MOUTH TWICE DAILY. 03/17/24   Katrinka Garnette KIDD, MD  fluticasone  (FLONASE ) 50 MCG/ACT nasal spray Place 1 spray into both nostrils daily. 11/15/22   Elliot Meldrum, Jodi R, NP  levonorgestrel -ethinyl estradiol (AVIANE) 0.1-20 MG-MCG tablet Take 1 tablet by mouth daily. 12/23/23   Katrinka Garnette KIDD, MD  spironolactone (ALDACTONE) 50 MG tablet TAKE 3 TABLET BY MOUTH EVERY DAY FOR 30 DAYS Orally Once a day for 30 days    [provider]  SUMAtriptan  (IMITREX ) 50 MG tablet TAKE 1 TABLET BY MOUTH EVERY 2  HOURS AS NEEDED FOR MIGRAINE(MAX 2 TABLETS PER DAY) MAY REPEAT IN 2 HOURS IF HEADACHE PERSISTS OR RECURS. 03/02/24   Katrinka Garnette KIDD, MD  tretinoin (RETIN-A) 0.025 % cream Apply 1 Application topically at bedtime. 04/11/23   [provider]  VIENVA 0.1-20 MG-MCG tablet TAKE 1 TABLET BY MOUTH DAILY 12/26/23   Katrinka Garnette KIDD, MD    Family History Family History  Problem Relation Age of Onset   Depression Mother        also obese   Drug abuse Mother        prescription drug abuse   Arrhythmia Father    Hyperlipidemia Father    Peripheral vascular disease Father        carotid artery   Alcohol abuse Father    Hypertension Father    Healthy Sister        half sister   Alcohol abuse Brother    Heart attack Maternal Grandmother    Other Maternal Grandfather        accidental death   Other Paternal Grandmother        enlarged heart never saw doctor   Colon cancer Paternal Grandmother    Heart attack Paternal Grandfather        under 50  Social History Social History   Tobacco Use   Smoking status: Former    Current packs/day: 0.00    Average packs/day: 0.3 packs/day for 6.0 years (1.5 ttl pk-yrs)    Types: Cigarettes    Start date: 05/29/2009    Quit date: 05/30/2015    Years since quitting: 8.8   Smokeless tobacco: Never  Vaping Use   Vaping status: Never Used  Substance Use Topics   Alcohol use: Yes    Alcohol/week: 9.0 standard drinks of alcohol    Types: 9 Shots of liquor per week   Drug use: Yes    Types: Marijuana     Allergies   Penicillin g   Review of Systems Review of Systems  HENT:  Positive for sore throat.      Physical Exam Triage Vital Signs ED Triage Vitals  Encounter Vitals Group     BP 04/10/24 1544 123/86     Girls Systolic BP Percentile --      Girls Diastolic BP Percentile --      Boys Systolic BP Percentile --      Boys Diastolic BP Percentile --      Pulse Rate 04/10/24 1544 (!) 52     Resp 04/10/24 1544 16      Temp 04/10/24 1544 98.2 F (36.8 C)     Temp Source 04/10/24 1544 Oral     SpO2 04/10/24 1544 98 %     Weight --      Height --      Head Circumference --      Peak Flow --      Pain Score 04/10/24 1542 3     Pain Loc --      Pain Education --      Exclude from Growth Chart --    No data found.  Updated Vital Signs BP 123/86   Pulse (!) 52   Temp 98.2 F (36.8 C) (Oral)   Resp 16   LMP 03/11/2024   SpO2 98%   Visual Acuity Right Eye Distance:   Left Eye Distance:   Bilateral Distance:    Right Eye Near:   Left Eye Near:    Bilateral Near:     Physical Exam Vitals and nursing note reviewed.  Constitutional:      General: She is not in acute distress.    Appearance: She is well-developed. She is not ill-appearing.  HENT:     Head: Normocephalic and atraumatic.     Right Ear: Tympanic membrane and ear canal normal.     Left Ear: Tympanic membrane and ear canal normal.     Nose: No congestion or rhinorrhea.     Mouth/Throat:     Mouth: Mucous membranes are moist.     Pharynx: Oropharynx is clear. Uvula midline. Posterior oropharyngeal erythema present.     Tonsils: No tonsillar exudate or tonsillar abscesses.  Eyes:     Conjunctiva/sclera: Conjunctivae normal.     Pupils: Pupils are equal, round, and reactive to light.  Cardiovascular:     Rate and Rhythm: Normal rate and regular rhythm.     Heart sounds: Normal heart sounds.  Pulmonary:     Effort: Pulmonary effort is normal.     Breath sounds: Normal breath sounds.  Musculoskeletal:     Cervical back: Normal range of motion and neck supple.  Lymphadenopathy:     Cervical: No cervical adenopathy.  Skin:    General: Skin is warm and dry.  Neurological:  General: No focal deficit present.     Mental Status: She is alert and oriented to person, place, and time.  Psychiatric:        Mood and Affect: Mood normal.        Behavior: Behavior normal.      UC Treatments / Results  Labs (all labs ordered  are listed, but only abnormal results are displayed) Labs Reviewed  CULTURE, GROUP A STREP [facility])  POCT RAPID STREP A (OFFICE)    EKG   Radiology No results found.  Procedures Procedures (including critical care time)  Medications Ordered in UC Medications - No data to display  Initial Impression / Assessment and Plan / UC Course  I have reviewed the triage vital signs and the nursing notes.  Pertinent labs & imaging results that were available during my care of the patient were reviewed by me and considered in my medical decision making (see chart for details).     Reviewed exam and symptoms with patient.  No red flags.  Negative rapid strep, will send strep throat culture.  Declines COVID testing.  Discussed viral illness and symptomatic treatment.  PCP follow-up if symptoms do not improve.  ER precautions reviewed. Final Clinical Impressions(s) / UC Diagnoses   Final diagnoses:  Sore throat  Viral illness     Discharge Instructions      The clinic will contact you with results of the testing done today are positive.  Please treat your symptoms with over the counter cough medication, tylenol or ibuprofen , humidifier, and rest. Viral illnesses can last 7-14 days. Please follow up with your PCP if your symptoms are not improving. Please go to the ER for any worsening symptoms. This includes but is not limited to fever you can not control with tylenol or ibuprofen , you are not able to stay hydrated, you have shortness of breath or chest pain.  Thank you for choosing [facility] for your healthcare needs. I hope you feel better soon!      ED Prescriptions   None    PDMP not reviewed this encounter.   Loreda Myla SAUNDERS, NP 04/10/24 [phone]

## 2024-04-10 NOTE — ED Triage Notes (Signed)
 Pt c/o sore throat, white patches on back of throat started yesterday, but worse today

## 2024-04-10 NOTE — Discharge Instructions (Signed)
 The clinic will contact you with results of the testing done today are positive.  Please treat your symptoms with over the counter cough medication, tylenol or ibuprofen , humidifier, and rest. Viral illnesses can last 7-14 days. Please follow up with your PCP if your symptoms are not improving. Please go to the ER for any worsening symptoms. This includes but is not limited to fever you can not control with tylenol or ibuprofen , you are not able to stay hydrated, you have shortness of breath or chest pain.  Thank you for choosing [facility] for your healthcare needs. I hope you feel better soon!

## 2024-04-11 LAB — CULTURE, GROUP A STREP (THRC)

## 2024-04-13 ENCOUNTER — Ambulatory Visit (INDEPENDENT_AMBULATORY_CARE_PROVIDER_SITE_OTHER): Admitting: Family Medicine

## 2024-04-13 ENCOUNTER — Ambulatory Visit (HOSPITAL_COMMUNITY): Payer: Self-pay

## 2024-04-13 VITALS — BP 102/70 | HR 86 | Temp 97.7°F | Ht 66.0 in | Wt 149.2 lb

## 2024-04-13 DIAGNOSIS — J020 Streptococcal pharyngitis: Secondary | ICD-10-CM

## 2024-04-13 MED ORDER — AMOXICILLIN 500 MG PO CAPS: 500.0000 mg | ORAL_CAPSULE | Freq: Two times a day (BID) | ORAL | 0 refills | Status: AC

## 2024-04-13 NOTE — Progress Notes (Signed)
 Phone [phone] In person visit   Subjective:   Zailah Zagami is a 33 y.o. year old very pleasant female patient who presents for/with See problem oriented charting Chief Complaint  Patient presents with   Sore Throat    Low grade fever yesterday; would like to discuss tonsillectomy;    Past Medical History-  Patient Active Problem List   Diagnosis Date Noted   Anxiety 02/21/2017    Priority: High   Alcohol abuse     Priority: High   Migraines     Priority: Medium    Oral contraceptive use 03/21/2015    Priority: Medium    Former smoker     Priority: Low    Medications- reviewed and updated Current Outpatient Medications  Medication Sig Dispense Refill   amoxicillin  (AMOXIL ) 500 MG capsule Take 1 capsule (500 mg total) by mouth 2 (two) times daily for 10 days. 20 capsule 0   busPIRone  (BUSPAR ) 5 MG tablet TAKE 1 TABLET(5 MG) BY MOUTH TWICE DAILY. 60 tablet 5   fluticasone  (FLONASE ) 50 MCG/ACT nasal spray Place 1 spray into both nostrils daily. 15.8 mL 0   spironolactone (ALDACTONE) 50 MG tablet TAKE 3 TABLET BY MOUTH EVERY DAY FOR 30 DAYS Orally Once a day for 30 days     SUMAtriptan  (IMITREX ) 50 MG tablet TAKE 1 TABLET BY MOUTH EVERY 2 HOURS AS NEEDED FOR MIGRAINE(MAX 2 TABLETS PER DAY) MAY REPEAT IN 2 HOURS IF HEADACHE PERSISTS OR RECURS. 10 tablet 0   tretinoin (RETIN-A) 0.025 % cream Apply 1 Application topically at bedtime.     VIENVA 0.1-20 MG-MCG tablet TAKE 1 TABLET BY MOUTH DAILY 28 tablet 12   No current facility-administered medications for this visit.     Objective:  BP 102/70 (BP Location: Left Arm, Patient Position: Sitting, Cuff Size: Normal)   Pulse 86   Temp 97.7 F (36.5 C) (Temporal)   Ht 5' 6 (1.676 m)   Wt 149 lb 3.2 oz (67.7 kg)   LMP 03/11/2024 (Exact Date)   SpO2 99%   BMI 24.08 kg/m  Gen: NAD, resting comfortably Erythema noted on bilateral tonsils extending onto the pharynx.  No tonsillar exudate.  No cervical lymphadenopathy.   Nasal turbinates normal.  Tympanic membranes normal. CV: RRR no murmurs rubs or gallops Lungs: CTAB no crackles, wheeze, rhonchi Ext: no edema Skin: warm, dry    Assessment and Plan    # Sore throat/fatigue S:symptoms started Friday more severely but Wednesday started with mild scratchy throat. By Friday worsening along with fatigue hitting her hard. Still went to work but left early on Saturday. Low grade temp 99.9 last night and felt bad. Tylenol has helped some. No cough or congestion.   Childhood allergy to penicillin with just nausea and vomiting around 4- has not retrialed. Has tolerated amoxicillin  in the past A/P: Strep throat but not group A strep (which would have picked up on point of care testing) but requires the same treatment essentially antibiotics for 10 days and would hold off on work until you have had 24 hours of this so can return Wednesday.    We discussed guidelines for ENT consult  The American Academy of Otolaryngology-Head and Neck Surgery (AAO-HNS) guidelines recommend tonsillectomy for recurrent tonsillitis if there are at least 7 episodes in the past year, 5 episodes per year for the past 2 years, or 3 episodes per year for the past 3 years, with each episode documented by specific clinical criteria.   Recommended  follow up: Return for as needed for new, worsening, persistent symptoms.  Lab/Order associations:   ICD-10-CM   1. Strep throat  J02.0       Meds ordered this encounter  Medications   amoxicillin  (AMOXIL ) 500 MG capsule    Sig: Take 1 capsule (500 mg total) by mouth 2 (two) times daily for 10 days.    Dispense:  20 capsule    Refill:  0    Return precautions advised.  Garnette Lukes, MD

## 2024-04-13 NOTE — Patient Instructions (Addendum)
 Strep throat but not group A strep (which would have picked up on point of care testing) but requires the same treatment essentially antibiotics for 10 days and would hold off on work until you have had 24 hours of this so can return Wednesday.   We discussed guidelines for ENT consult  The American Academy of Otolaryngology-Head and Neck Surgery (AAO-HNS) guidelines recommend tonsillectomy for recurrent tonsillitis if there are at least 7 episodes in the past year, 5 episodes per year for the past 2 years, or 3 episodes per year for the past 3 years, with each episode documented by specific clinical criteria.   Recommended follow up: Return for as needed for new, worsening, persistent symptoms.

## 2024-07-13 ENCOUNTER — Other Ambulatory Visit: Payer: Self-pay

## 2024-07-13 ENCOUNTER — Ambulatory Visit (HOSPITAL_COMMUNITY)
Admission: RE | Admit: 2024-07-13 | Discharge: 2024-07-13 | Disposition: A | Discharge status: Home / Self Care | Source: Ambulatory Visit

## 2024-07-13 ENCOUNTER — Encounter (HOSPITAL_COMMUNITY): Payer: Self-pay

## 2024-07-13 VITALS — BP 119/81 | HR 81 | Temp 98.9°F | Resp 18

## 2024-07-13 DIAGNOSIS — R509 Fever, unspecified: Secondary | ICD-10-CM | POA: Insufficient documentation

## 2024-07-13 DIAGNOSIS — J020 Streptococcal pharyngitis: Secondary | ICD-10-CM | POA: Diagnosis not present

## 2024-07-13 DIAGNOSIS — J029 Acute pharyngitis, unspecified: Secondary | ICD-10-CM | POA: Diagnosis not present

## 2024-07-13 LAB — POC SOFIA SARS ANTIGEN FIA: SARS Coronavirus 2 Ag: NEGATIVE

## 2024-07-13 LAB — POCT RAPID STREP A (OFFICE): Rapid Strep A Screen: NEGATIVE

## 2024-07-13 MED ORDER — AMOXICILLIN 875 MG PO TABS: 875.0000 mg | ORAL_TABLET | Freq: Two times a day (BID) | ORAL | 0 refills | Status: AC

## 2024-07-13 NOTE — ED Notes (Signed)
 Did a home covid/flu combo test yesterday-all negative, but test was expired

## 2024-07-13 NOTE — ED Triage Notes (Signed)
 Patient complains of sore throat, headache, body aches and chills.  Concerned for strep B .  Patient reports sore throat started Saturday night.  Sunday had a fever 101.2.    Patient has taken advil  and acetaminophen.   Took advil /tylenol combination med around 6 am this morning

## 2024-07-13 NOTE — ED Provider Notes (Signed)
 " [facility]    CSN: [id number] Arrival date & time: 07/13/24  9040      History   Chief Complaint Chief Complaint  Patient presents with   Appointment    10:00   Sore Throat    HPI Heidi Long is a 34 y.o. female.   Patient presents today with a several day history of sore throat, headache, body aches, chills, fever with Tmax of 101.2 F.  Denies any significant congestion, cough, shortness of breath, nausea/vomiting.  She does have a history of recurrent throat infections and generally test positive on culture for strep B.  She reports that her current symptoms are similar to previous episodes of this condition.  She has been taking Tylenol/ibuprofen  combination which provides improvement of symptoms.  She denies any recent antibiotics; she was last treated for throat infection in October 2025 with amoxicillin .  She denies any swelling of her throat, shortness of breath, muffled voice.  She is eating and drinking normally.  Denies any known sick contacts.  She did take an at home COVID/flu test that was negative.    Past Medical History:  Diagnosis Date   Alcohol abuse    Cut down on her own. drunk on regular basis in past in college. Prescription drug abuse- oxycodone and xanax snorting stopped a year or two after college.    Former smoker    quit 2016. 1/4 PPD 7 years.    Migraines    2016 was getting them frequently- doing much better now. no medicine    Patient Active Problem List   Diagnosis Date Noted   Anxiety 02/21/2017   Former smoker    Migraines    Alcohol abuse    Oral contraceptive use 03/21/2015    Past Surgical History:  Procedure Laterality Date   WISDOM TOOTH EXTRACTION      OB History   No obstetric history on file.      Home Medications    Prior to Admission medications  Medication Sig Start Date End Date Taking? Authorizing Provider  amoxicillin  (AMOXIL ) 875 MG tablet Take 1 tablet (875 mg total) by mouth 2 (two) times  daily. 07/13/24  Yes Nat Lowenthal K, PA-C  tretinoin (RETIN-A) 0.025 % cream 1 application in the evening to face Externally Once a day 03/01/20  Yes [provider]  busPIRone  (BUSPAR ) 5 MG tablet TAKE 1 TABLET(5 MG) BY MOUTH TWICE DAILY. 03/17/24   Katrinka Garnette KIDD, MD  spironolactone (ALDACTONE) 50 MG tablet TAKE 3 TABLET BY MOUTH EVERY DAY FOR 30 DAYS Orally Once a day for 30 days    [provider]  spironolactone (ALDACTONE) 50 MG tablet TAKE 3 TABLET BY MOUTH EVERY DAY FOR 30 DAYS Orally Once a day; Duration: 90 days    [provider]  SUMAtriptan  (IMITREX ) 50 MG tablet TAKE 1 TABLET BY MOUTH EVERY 2 HOURS AS NEEDED FOR MIGRAINE(MAX 2 TABLETS PER DAY) MAY REPEAT IN 2 HOURS IF HEADACHE PERSISTS OR RECURS. 03/02/24   Katrinka Garnette KIDD, MD  tretinoin (RETIN-A) 0.025 % cream Apply 1 Application topically at bedtime. 04/11/23   [provider]  VIENVA 0.1-20 MG-MCG tablet TAKE 1 TABLET BY MOUTH DAILY 12/26/23   Katrinka Garnette KIDD, MD    Family History Family History  Problem Relation Age of Onset   Depression Mother        also obese   Drug abuse Mother        prescription drug abuse  Arrhythmia Father    Hyperlipidemia Father    Peripheral vascular disease Father        carotid artery   Alcohol abuse Father    Hypertension Father    Healthy Sister        half sister   Alcohol abuse Brother    Heart attack Maternal Grandmother    Other Maternal Grandfather        accidental death   Other Paternal Grandmother        enlarged heart never saw doctor   Colon cancer Paternal Grandmother    Heart attack Paternal Grandfather        under 96    Social History Social History[1]   Allergies   Penicillin g   Review of Systems Review of Systems  Constitutional:  Positive for activity change and fever. Negative for appetite change and fatigue.  HENT:  Positive for sore throat. Negative for congestion, sneezing, trouble swallowing and voice change.    Respiratory:  Negative for cough and shortness of breath.   Cardiovascular:  Negative for chest pain.  Gastrointestinal:  Negative for diarrhea, nausea and vomiting.  Neurological:  Negative for dizziness, light-headedness and headaches.     Physical Exam Triage Vital Signs ED Triage Vitals  Encounter Vitals Group     BP 07/13/24 1029 119/81     Girls Systolic BP Percentile --      Girls Diastolic BP Percentile --      Boys Systolic BP Percentile --      Boys Diastolic BP Percentile --      Pulse Rate 07/13/24 1029 81     Resp 07/13/24 1029 18     Temp 07/13/24 1029 98.9 F (37.2 C)     Temp Source 07/13/24 1029 Oral     SpO2 07/13/24 1029 96 %     Weight --      Height --      Head Circumference --      Peak Flow --      Pain Score 07/13/24 1025 1     Pain Loc --      Pain Education --      Exclude from Growth Chart --    No data found.  Updated Vital Signs BP 119/81 (BP Location: Right Arm)   Pulse 81   Temp 98.9 F (37.2 C) (Oral)   Resp 18   LMP 07/06/2024 (Approximate)   SpO2 96%   Visual Acuity Right Eye Distance:   Left Eye Distance:   Bilateral Distance:    Right Eye Near:   Left Eye Near:    Bilateral Near:     Physical Exam Vitals reviewed.  Constitutional:      General: She is awake. She is not in acute distress.    Appearance: Normal appearance. She is well-developed. She is not ill-appearing.     Comments: Very pleasant female appears stated age in no acute distress sitting comfortably in exam room  HENT:     Head: Normocephalic and atraumatic.     Right Ear: Tympanic membrane, ear canal and external ear normal. Tympanic membrane is not erythematous or bulging.     Left Ear: Tympanic membrane, ear canal and external ear normal. Tympanic membrane is not erythematous or bulging.     Nose:     Right Sinus: No maxillary sinus tenderness or frontal sinus tenderness.     Left Sinus: No maxillary sinus tenderness or frontal sinus tenderness.      Mouth/Throat:  Pharynx: Uvula midline. Posterior oropharyngeal erythema present. No oropharyngeal exudate.     Tonsils: Tonsillar exudate present. No tonsillar abscesses. 1+ on the right. 1+ on the left.  Cardiovascular:     Rate and Rhythm: Normal rate and regular rhythm.     Heart sounds: Normal heart sounds, S1 normal and S2 normal. No murmur heard. Pulmonary:     Effort: Pulmonary effort is normal.     Breath sounds: Normal breath sounds. No wheezing, rhonchi or rales.     Comments: Clear to auscultation bilaterally Psychiatric:        Behavior: Behavior is cooperative.      UC Treatments / Results  Labs (all labs ordered are listed, but only abnormal results are displayed) Labs Reviewed  POCT RAPID STREP A (OFFICE) - Normal  CULTURE, GROUP A STREP ([facility])  POC SOFIA SARS ANTIGEN FIA    EKG   Radiology No results found.  Procedures Procedures (including critical care time)  Medications Ordered in UC Medications - No data to display  Initial Impression / Assessment and Plan / UC Course  I have reviewed the triage vital signs and the nursing notes.  Pertinent labs & imaging results that were available during my care of the patient were reviewed by me and considered in my medical decision making (see chart for details).     Patient is well-appearing, afebrile, nontoxic, nontachycardic.  She had a negative rapid strep in clinic and a negative COVID test.  For the past 3 times she has been evaluated in 2025 she had a negative strep but a positive culture and reports that her current symptoms are similar to previous episodes of this condition.  I agreed to start antibiotics (amoxicillin  875 mg twice daily for 10 days) but will discontinue these if the culture comes back negative.  No indication for dose adjustment based on metabolic panel from 10/25/2022 with a creatinine of 0.80 and calculated creatinine clearance of 107 mL/min.  We discussed that antibiotics can decrease  the effectiveness of birth control so she should use backup birth control such as a condom until her next menstrual cycle.  She can continue the over-the-counter analgesics as well as gargle with warm salt water for pain relief.  We discussed that if her symptoms are not improving within a few days or if anything worsens and she has swelling of her throat, shortness of breath, muffled voice, high fever she needs to be seen emergently.  Given her recurrent symptoms I did recommend she follow-up with ENT and she was given the contact information for local provider with instruction to call to schedule appointment.  Return precautions given.  Excuse note provided.  Final Clinical Impressions(s) / UC Diagnoses   Final diagnoses:  Sore throat  Fever, unspecified  Recurrent streptococcal pharyngitis     Discharge Instructions      You tested negative in clinic today but I am going to start the antibiotic.  We will send this for culture and if this comes back negative we will stop the antibiotic.  In the meantime, continue your Tylenol/ibuprofen  and gargle with warm salt water.  Since you continue to have these recurrent infections may be worthwhile to follow-up with an ENT.  Call them to schedule an appointment.  This and all antibiotics can decrease the effectiveness of your birth control so use backup birth control such as a condom while you are on the antibiotic and until your next menstrual cycle.  If you have any swelling of  your throat, shortness of breath, muffled voice, high fever not responding to antipyretics, difficulty swallowing or speaking you need to be seen in the emergency room.     ED Prescriptions     Medication Sig Dispense Auth. Provider   amoxicillin  (AMOXIL ) 875 MG tablet Take 1 tablet (875 mg total) by mouth 2 (two) times daily. 20 tablet Iliyah Bui K, PA-C      PDMP not reviewed this encounter.    [1]  Social History Tobacco Use   Smoking status: Former    Current  packs/day: 0.00    Average packs/day: 0.3 packs/day for 6.0 years (1.5 ttl pk-yrs)    Types: Cigarettes    Start date: 05/29/2009    Quit date: 05/30/2015    Years since quitting: 9.1   Smokeless tobacco: Never  Vaping Use   Vaping status: Never Used  Substance Use Topics   Alcohol use: Yes    Alcohol/week: 9.0 standard drinks of alcohol    Types: 9 Shots of liquor per week   Drug use: Not Currently    Types: Marijuana     Sherrell Rocky POUR, PA-C 07/13/24 1107  "

## 2024-07-13 NOTE — Discharge Instructions (Signed)
 You tested negative in clinic today but I am going to start the antibiotic.  We will send this for culture and if this comes back negative we will stop the antibiotic.  In the meantime, continue your Tylenol/ibuprofen  and gargle with warm salt water.  Since you continue to have these recurrent infections may be worthwhile to follow-up with an ENT.  Call them to schedule an appointment.  This and all antibiotics can decrease the effectiveness of your birth control so use backup birth control such as a condom while you are on the antibiotic and until your next menstrual cycle.  If you have any swelling of your throat, shortness of breath, muffled voice, high fever not responding to antipyretics, difficulty swallowing or speaking you need to be seen in the emergency room.

## 2024-07-15 ENCOUNTER — Ambulatory Visit: Payer: Self-pay

## 2024-07-15 LAB — CULTURE, GROUP A STREP (THRC)
